# Patient Record
Sex: Female | Born: 1935 | Smoking: Former smoker
Health system: Southern US, Community
[De-identification: ages and names within clinical notes are randomized; demographics above are authoritative.]

## PROBLEM LIST (undated history)

## (undated) DIAGNOSIS — R531 Weakness: Secondary | ICD-10-CM

## (undated) DIAGNOSIS — R269 Unspecified abnormalities of gait and mobility: Secondary | ICD-10-CM

## (undated) DIAGNOSIS — E119 Type 2 diabetes mellitus without complications: Secondary | ICD-10-CM

## (undated) DIAGNOSIS — G609 Hereditary and idiopathic neuropathy, unspecified: Secondary | ICD-10-CM

## (undated) DIAGNOSIS — R7989 Other specified abnormal findings of blood chemistry: Secondary | ICD-10-CM

## (undated) DIAGNOSIS — E538 Deficiency of other specified B group vitamins: Secondary | ICD-10-CM

## (undated) DIAGNOSIS — G20 Parkinson's disease: Secondary | ICD-10-CM

## (undated) DIAGNOSIS — I10 Essential (primary) hypertension: Secondary | ICD-10-CM

## (undated) DIAGNOSIS — Z7409 Other reduced mobility: Secondary | ICD-10-CM

## (undated) DIAGNOSIS — E785 Hyperlipidemia, unspecified: Secondary | ICD-10-CM

## (undated) DIAGNOSIS — R262 Difficulty in walking, not elsewhere classified: Secondary | ICD-10-CM

## (undated) DIAGNOSIS — D649 Anemia, unspecified: Secondary | ICD-10-CM

## (undated) DIAGNOSIS — G20A1 Parkinson's disease without dyskinesia, without mention of fluctuations: Secondary | ICD-10-CM

## (undated) DIAGNOSIS — R410 Disorientation, unspecified: Secondary | ICD-10-CM

## (undated) HISTORY — DX: Other specified abnormal findings of blood chemistry: R79.89

## (undated) HISTORY — DX: Unspecified abnormalities of gait and mobility: R26.9

## (undated) HISTORY — DX: Deficiency of other specified B group vitamins: E53.8

## (undated) HISTORY — DX: Difficulty in walking, not elsewhere classified: R26.2

## (undated) HISTORY — DX: Parkinson's disease without dyskinesia, without mention of fluctuations: G20.A1

## (undated) HISTORY — DX: Weakness: R53.1

## (undated) HISTORY — DX: Hereditary and idiopathic neuropathy, unspecified: G60.9

## (undated) HISTORY — PX: APPENDECTOMY: SHX54

## (undated) HISTORY — DX: Essential (primary) hypertension: I10

## (undated) HISTORY — DX: Anemia, unspecified: D64.9

## (undated) HISTORY — DX: Other reduced mobility: Z74.09

## (undated) HISTORY — DX: Parkinson's disease: G20

## (undated) HISTORY — DX: Type 2 diabetes mellitus without complications: E11.9

## (undated) HISTORY — DX: Disorientation, unspecified: R41.0

## (undated) HISTORY — DX: Hyperlipidemia, unspecified: E78.5

---

## 2016-04-23 LAB — HEPATIC FUNCTION PANEL
AST: 19 U/L (ref 13–35)
Alkaline Phosphatase: 38 U/L (ref 25–125)
Bilirubin, Total: 0.4 mg/dL

## 2016-04-28 LAB — CBC AND DIFFERENTIAL
HCT: 34 % — AB (ref 36–46)
Hemoglobin: 11.9 g/dL — AB (ref 12.0–16.0)
PLATELETS: 238 10*3/uL (ref 150–399)
WBC: 8.9 10*3/mL

## 2016-04-28 LAB — BASIC METABOLIC PANEL
BUN: 10 mg/dL (ref 4–21)
Creatinine: 0.5 mg/dL (ref 0.5–1.1)
POTASSIUM: 4.1 mmol/L (ref 3.4–5.3)
SODIUM: 132 mmol/L — AB (ref 137–147)

## 2016-04-29 ENCOUNTER — Encounter: Payer: Self-pay | Admitting: Internal Medicine

## 2016-04-29 ENCOUNTER — Non-Acute Institutional Stay (SKILLED_NURSING_FACILITY): Payer: PPO | Admitting: Internal Medicine

## 2016-04-29 DIAGNOSIS — R2681 Unsteadiness on feet: Secondary | ICD-10-CM | POA: Diagnosis not present

## 2016-04-29 DIAGNOSIS — F329 Major depressive disorder, single episode, unspecified: Secondary | ICD-10-CM | POA: Diagnosis not present

## 2016-04-29 DIAGNOSIS — E039 Hypothyroidism, unspecified: Secondary | ICD-10-CM | POA: Insufficient documentation

## 2016-04-29 DIAGNOSIS — E538 Deficiency of other specified B group vitamins: Secondary | ICD-10-CM

## 2016-04-29 DIAGNOSIS — E119 Type 2 diabetes mellitus without complications: Secondary | ICD-10-CM

## 2016-04-29 DIAGNOSIS — E871 Hypo-osmolality and hyponatremia: Secondary | ICD-10-CM

## 2016-04-29 DIAGNOSIS — E038 Other specified hypothyroidism: Secondary | ICD-10-CM | POA: Diagnosis not present

## 2016-04-29 DIAGNOSIS — G2581 Restless legs syndrome: Secondary | ICD-10-CM

## 2016-04-29 DIAGNOSIS — G20 Parkinson's disease: Secondary | ICD-10-CM | POA: Diagnosis not present

## 2016-04-29 DIAGNOSIS — E785 Hyperlipidemia, unspecified: Secondary | ICD-10-CM | POA: Diagnosis not present

## 2016-04-29 DIAGNOSIS — R531 Weakness: Secondary | ICD-10-CM

## 2016-04-29 DIAGNOSIS — I10 Essential (primary) hypertension: Secondary | ICD-10-CM | POA: Diagnosis not present

## 2016-04-29 DIAGNOSIS — M810 Age-related osteoporosis without current pathological fracture: Secondary | ICD-10-CM | POA: Diagnosis not present

## 2016-04-29 DIAGNOSIS — R4189 Other symptoms and signs involving cognitive functions and awareness: Secondary | ICD-10-CM

## 2016-04-29 NOTE — Progress Notes (Signed)
LOCATION: [facility]  PCP: No primary care provider on file.   Code Status: Full Code  Goals of care: Advanced Directive information No flowsheet data found.     No emergency contact information on file.   No Known Allergies  Chief Complaint  Patient presents with  . New Admit To SNF    New Admission Visit     HPI:  Patient is a 82 y.o. female seen today for short term rehabilitation post hospital admission from 04/23/16-04/28/16 with altered mental state and gait instability. MRI brain was negative for acute intracranila abnormalities. She was placed on iv fluids and her hctz was discontinued. She was seen by neurology and her sinemet dosing was increased to 5 days a week. Her b12 supplement for b12 def anemia was continued. She has history of parkinson's disease and possible dementia. She is seen in her room today. She is pleasantly confused.  Review of Systems:  Review of Systems  Constitutional: Negative for fever, chills. Feels weak and tired.  HENT: Negative for congestion, hearing loss and sore throat.   Eyes: Negative for blurred vision, double vision and discharge.  Respiratory: Negative for cough, shortness of breath and wheezing.   Cardiovascular: Negative for chest pain, palpitations Gastrointestinal: Negative for heartburn, nausea, vomiting, abdominal pain. Had bowel movement this am Genitourinary: Negative for dysuria, flank pain.  Musculoskeletal: Negative for back pain, fall.  Skin: Negative for itching and rash.  Neurological: negative for dizziness and headaches.  Psychiatric/Behavioral: Negative for depression   Past Medical History:  Diagnosis Date  . Anemia   . B12 deficiency   . Delirium   . Diabetes mellitus without complication (HCC)   . Generalized weakness   . Hyperlipidemia   . Hypertension   . Idiopathic peripheral neuropathy (HCC)   . Inability to walk   . Mobility impaired   . Neurologic gait dysfunction   . Parkinson's  disease [facility])    Past Surgical History:  Procedure Laterality Date  . APPENDECTOMY     Social History:   reports that she has never smoked. She has never used smokeless tobacco. She reports that she does not drink alcohol. Her drug history is not on file.  Family History  Problem Relation Age of Onset  . Hypertension Mother   . Hypertension Father   . Heart disease Father   . Heart disease Sister   . Diabetes Sister   . Cancer Sister   . Cancer Brother     Medications:   Medication List       Accurate as of 04/29/16  1:24 PM. Always use your most recent med list.          alendronate 70 MG tablet Commonly known as:  FOSAMAX Take 70 mg by mouth once a week. Take with a full glass of water on an empty stomach.   ascorbic acid 500 MG tablet Commonly known as:  VITAMIN C Take 500 mg by mouth daily.   aspirin EC 81 MG tablet Take 81 mg by mouth daily.   b complex vitamins capsule Take 1 capsule by mouth daily.   Biotin [address] MCG Tabs Take 1 tablet by mouth daily.   calcium-vitamin D 250-125 MG-UNIT tablet Commonly known as:  OSCAL WITH D Take 1 tablet by mouth daily.   carbidopa-levodopa 25-100 MG tablet Commonly known as:  SINEMET IR Take 1 tablet by mouth 5 (five) times daily.   carboxymethylcellulose 0.5 % Soln Commonly known as:  REFRESH PLUS  Place 1 drop into both eyes as needed.   cyanocobalamin 1000 MCG/ML injection Commonly known as:  (VITAMIN B-12) Inject 1,000 mcg into the muscle every 30 (thirty) days. Give 1 mL into the muscle   DULoxetine 60 MG capsule Commonly known as:  CYMBALTA Take 60 mg by mouth daily.   hydrochlorothiazide 25 MG tablet Commonly known as:  HYDRODIURIL Take 25 mg by mouth daily as needed.   levothyroxine 50 MCG tablet Commonly known as:  SYNTHROID, LEVOTHROID Take 50 mcg by mouth daily before breakfast.   metFORMIN 500 MG tablet Commonly known as:  GLUCOPHAGE Take by mouth daily with breakfast.   metFORMIN 500  MG tablet Commonly known as:  GLUCOPHAGE Take 500 mg by mouth every evening.   mirtazapine 15 MG tablet Commonly known as:  REMERON Take 15 mg by mouth at bedtime.   rOPINIRole 0.5 MG tablet Commonly known as:  REQUIP Take 0.5 mg by mouth 3 (three) times daily.   rosuvastatin 10 MG tablet Commonly known as:  CRESTOR Take 10 mg by mouth daily.   thiamine 100 MG tablet Take 100 mg by mouth daily.   traMADol 50 MG tablet Commonly known as:  ULTRAM Take 50 mg by mouth every 6 (six) hours as needed for moderate pain.       Immunizations:  There is no immunization history on file for this patient.   Physical Exam:  Vitals:   04/29/16 1208  BP: (!) 181/77  Pulse: 90  Resp: 20  Temp: 97.8 F (36.6 C)  TempSrc: Oral  SpO2: 96%  Weight: 150 lb (68 kg)  Height: 5\' 6"  (1.676 m)   Body mass index is 24.21 kg/m.  General- elderly female, well built, frail, in no acute distress Head- normocephalic, atraumatic Nose- no maxillary or frontal sinus tenderness, no nasal discharge Throat- moist mucus membrane Eyes- PERRLA, EOMI, no pallor, no icterus, no discharge, normal conjunctiva, normal sclera Neck- no cervical lymphadenopathy Cardiovascular- normal s1,s2, no murmur, no leg edema Respiratory- bilateral clear to auscultation, no wheeze, no rhonchi, no crackles, no use of accessory muscles Abdomen- bowel sounds present, soft, non tender Musculoskeletal- able to move all 4 extremities, generalized weakness, on wheelchair Neurological- alert and oriented to self Skin- warm and dry, bruise to arms Psychiatry- normal mood and affect    Labs reviewed: Basic Metabolic Panel:  Recent Labs  16/05/9608/27/17  NA 132*  K 4.1  BUN 10  CREATININE 0.5   Liver Function Tests:  Recent Labs  04/23/16  AST 19  ALKPHOS 38   No results for input(s): LIPASE, AMYLASE in the last 8760 hours. No results for input(s): AMMONIA in the last 8760 hours. CBC:  Recent Labs  04/28/16    WBC 8.9  HGB 11.9*  HCT 34*  PLT 238     Radiological Exams: MRI Brain Wo Contrast 04/23/16  Impression. Brain: No acute infarction, hemorrhage, hydrocephalus, extra-axial collection or mass lesion. Mild chronic microvascular ischemic changes and parenchymal volume loss. Vascular: normal flow voids. Skull and upper cervical spine: Normal marrow signal. Sinuses/orbits: Negative. Bilateral intra-ocular lens replacements.   Assessment/Plan  Generalized weakness Will have her work with physical therapy and occupational therapy team to help with gait training and muscle strengthening exercises.fall precautions. Skin care. Encourage to be out of bed.   Gait instability With her parkinson's disease likely contributing along with deconditioning. Will have patient work with PT/OT as tolerated to regain strength and restore function.  Fall precautions are in place.  Parkinson's  disease Continue sinemet 25-100 mg five times a day. Fall precautions. Neurology follow up  Osteoporosis Continue her fosamax with ca-vit d  HLD Continue crestor  b12 def Continue monthly b12 injection, monitor cbc  Chronic depression Continue her cymbalta and remeron and monitor  Cognitive impairment SLP to evaluate. Assistance with feed for now and supportive care. Fall precautions  HTN Monitor bp and bmp, continue hctz 25 mg daily  Hyponatremia Monitor bmp with her on hctz  Hypothyroidism Continue levothyroxine and monitor  Dm type 2 Monitor cbg, continue metformin 500 mg q am and 1000 mg qhs  RLS Continue requip and monitor   Goals of care: short term rehabilitation   Labs/tests ordered: cbc, cmp   Family/ staff Communication: reviewed care plan with patient and nursing supervisor    Oneal Grout, MD Internal Medicine [facility] [address] [place], [place] [address] Cell Phone (Monday-Friday 8 am - 5 pm): [phone] On Call: [phone]  and follow prompts after 5 pm and on weekends Office Phone: [phone] Office Fax: [phone]

## 2016-05-03 LAB — CBC AND DIFFERENTIAL
HEMATOCRIT: 36 % (ref 36–46)
HEMOGLOBIN: 12.8 g/dL (ref 12.0–16.0)
Neutrophils Absolute: 5 /uL
Platelets: 266 10*3/uL (ref 150–399)
WBC: 8.1 10^3/mL

## 2016-05-03 LAB — BASIC METABOLIC PANEL
BUN: 11 mg/dL (ref 4–21)
CREATININE: 0.5 mg/dL (ref 0.5–1.1)
Glucose: 104 mg/dL
POTASSIUM: 3.6 mmol/L (ref 3.4–5.3)
SODIUM: 133 mmol/L — AB (ref 137–147)

## 2016-05-03 LAB — HEPATIC FUNCTION PANEL
ALK PHOS: 44 U/L (ref 25–125)
ALT: 6 U/L — AB (ref 7–35)
AST: 16 U/L (ref 13–35)
Bilirubin, Total: 0.6 mg/dL

## 2016-05-24 ENCOUNTER — Non-Acute Institutional Stay (SKILLED_NURSING_FACILITY): Payer: PPO | Admitting: Adult Health

## 2016-05-24 ENCOUNTER — Encounter: Payer: Self-pay | Admitting: Adult Health

## 2016-05-24 DIAGNOSIS — R4189 Other symptoms and signs involving cognitive functions and awareness: Secondary | ICD-10-CM

## 2016-05-24 DIAGNOSIS — G4700 Insomnia, unspecified: Secondary | ICD-10-CM

## 2016-05-24 DIAGNOSIS — F329 Major depressive disorder, single episode, unspecified: Secondary | ICD-10-CM

## 2016-05-24 DIAGNOSIS — E1310 Other specified diabetes mellitus with ketoacidosis without coma: Secondary | ICD-10-CM | POA: Diagnosis not present

## 2016-05-24 DIAGNOSIS — M810 Age-related osteoporosis without current pathological fracture: Secondary | ICD-10-CM

## 2016-05-24 DIAGNOSIS — I10 Essential (primary) hypertension: Secondary | ICD-10-CM

## 2016-05-24 DIAGNOSIS — E785 Hyperlipidemia, unspecified: Secondary | ICD-10-CM | POA: Diagnosis not present

## 2016-05-24 DIAGNOSIS — R531 Weakness: Secondary | ICD-10-CM

## 2016-05-24 DIAGNOSIS — Z794 Long term (current) use of insulin: Secondary | ICD-10-CM | POA: Diagnosis not present

## 2016-05-24 DIAGNOSIS — E038 Other specified hypothyroidism: Secondary | ICD-10-CM

## 2016-05-24 DIAGNOSIS — G2581 Restless legs syndrome: Secondary | ICD-10-CM | POA: Diagnosis not present

## 2016-05-24 DIAGNOSIS — E1110 Type 2 diabetes mellitus with ketoacidosis without coma: Secondary | ICD-10-CM

## 2016-05-24 DIAGNOSIS — G20 Parkinson's disease: Secondary | ICD-10-CM

## 2016-05-24 NOTE — Progress Notes (Signed)
Patient ID: Brandy Martinez, female   DOB: Apr 09, 1932, 82 y.o.   MRN: [id number]    DATE:    05/24/16   MRN:  [id number]  BIRTHDAY: Apr 09, 1932  Facility:  Nursing Home Location:  [facility]  Nursing Home Room Number: 901-B  LEVEL OF CARE:  SNF [phone])  Contact Information    Name Relation Home Work Mobile   KirkAustin,Richard Nephew [phone]  [phone]   Drucilla SchmidtBean,Cindy Niece [phone]       Code Status History    This patient does not have a recorded code status. Please follow your organizational policy for patients in this situation.       Chief Complaint  Patient presents with  . Medical Management of Chronic Issues    HISTORY OF PRESENT ILLNESS:  This is an 82 year old female who is currently having a short-term rehabilitation @ [address] [facility]. She is being seen for a routine visit. Marland Kitchen. She was recently started on Melatonin for insomnia. HCTZ PRN is now given daily routinely.  [facility]  04/23/16 - 04/28/16. She was having altered mental state and gait instability. MRI brain was negative for acute intracranial abnormalities. She was given IV fluids and her HCTZ was discontinued. Neurology was consulted and Sinemet dosing was increased to 5 days a week. Her B12 supplementation was continued.   PAST MEDICAL HISTORY:  Past Medical History:  Diagnosis Date  . Anemia   . B12 deficiency   . Delirium   . Diabetes mellitus without complication (HCC)   . Generalized weakness   . Hyperlipidemia   . Hypertension   . Idiopathic peripheral neuropathy   . Inability to walk   . Mobility impaired   . Neurologic gait dysfunction   . Parkinson's disease (HCC)      CURRENT MEDICATIONS: Reviewed  Patient's Medications  New Prescriptions   No medications on file  Previous Medications   ACETAMINOPHEN (TYLENOL) 325 MG TABLET    Take 650 mg by mouth every  4 (four) hours as needed for mild pain or moderate pain.   ALENDRONATE (FOSAMAX) 70 MG TABLET    Take 70 mg by mouth once a week. Take with a full glass of water on an empty stomach.  Take on Thursdays.   ASCORBIC ACID (VITAMIN C) 500 MG TABLET    Take 500 mg by mouth daily.   ASPIRIN EC 81 MG TABLET    Take 81 mg by mouth daily.   B COMPLEX VITAMINS CAPSULE    Take 1 capsule by mouth daily.   BIOTIN 3875610000 MCG TABS    Take 1 tablet by mouth daily.   CALCIUM-VITAMIN D (OSCAL WITH D) 500-200 MG-UNIT TABLET    Take 1 tablet by mouth daily.   CARBIDOPA-LEVODOPA (SINEMET IR) 25-100 MG TABLET    Take 1 tablet by mouth 5 (five) times daily.    CARBOXYMETHYLCELLULOSE (REFRESH PLUS) 0.5 % SOLN    Place 1 drop into both eyes as needed.   CYANOCOBALAMIN (,VITAMIN B-12,) 1000 MCG/ML INJECTION    Inject 1,000 mcg into the muscle every 30 (thirty) days. Give 1 mL into the muscle   DULOXETINE (CYMBALTA) 60 MG CAPSULE    Take 60 mg by mouth daily.   HYDROCHLOROTHIAZIDE (HYDRODIURIL) 25 MG TABLET    Take 25 mg by mouth daily as needed.   LEVOTHYROXINE (SYNTHROID, LEVOTHROID) 50 MCG TABLET    Take 50 mcg by  mouth daily before breakfast.   MELATONIN 3 MG TABS    Take 1 tablet by mouth at bedtime.   METFORMIN (GLUCOPHAGE) 500 MG TABLET    Take by mouth daily with breakfast.   METFORMIN (GLUCOPHAGE) 500 MG TABLET    Take 1,000 mg by mouth every evening.    METHYLFOL-METHYLCOB-ACETYLCYST (CEREFOLIN NAC) 6-2-600 MG TABS    Take 1 tablet by mouth at bedtime.   MIRTAZAPINE (REMERON) 15 MG TABLET    Take 15 mg by mouth at bedtime.    ROPINIROLE (REQUIP) 0.5 MG TABLET    Take 0.5 mg by mouth 3 (three) times daily.    ROSUVASTATIN (CRESTOR) 10 MG TABLET    Take 10 mg by mouth daily.   THIAMINE 100 MG TABLET    Take 100 mg by mouth daily.   TRAMADOL (ULTRAM) 50 MG TABLET    Take 50 mg by mouth every 6 (six) hours as needed for moderate pain.  Modified Medications   No medications on file  Discontinued Medications    CALCIUM-VITAMIN D (OSCAL WITH D) 250-125 MG-UNIT TABLET    Take 1 tablet by mouth daily.     No Known Allergies   REVIEW OF SYSTEMS:  GENERAL: no change in appetite, no fatigue, no weight changes, no fever, chills or weakness EYES: Denies change in vision, dry eyes, eye pain, itching or discharge EARS: Denies change in hearing, ringing in ears, or earache NOSE: Denies nasal congestion or epistaxis MOUTH and THROAT: Denies oral discomfort, gingival pain or bleeding, pain from teeth or hoarseness   RESPIRATORY: no cough, SOB, DOE, wheezing, hemoptysis CARDIAC: no chest pain, edema or palpitations GI: no abdominal pain, diarrhea, constipation, heart burn, nausea or vomiting GU: Denies dysuria, frequency, hematuria, incontinence, or discharge PSYCHIATRIC: Denies feeling of depression or anxiety. No report of hallucinations, insomnia, paranoia, or agitation     PHYSICAL EXAMINATION  GENERAL APPEARANCE: Well nourished. In no acute distress. Normal body habitus SKIN:  Skin is warm and dry.  HEAD: Normal in size and contour. No evidence of trauma EYES: Lids open and close normally. No blepharitis, entropion or ectropion. PERRL. Conjunctivae are clear and sclerae are white. Lenses are without opacity EARS: Pinnae are normal. Patient hears normal voice tunes of the examiner MOUTH and THROAT: Lips are without lesions. Oral mucosa is moist and without lesions. Tongue is normal in shape, size, and color and without lesions NECK: supple, trachea midline, no neck masses, no thyroid tenderness, no thyromegaly LYMPHATICS: no LAN in the neck, no supraclavicular LAN RESPIRATORY: breathing is even & unlabored, BS CTAB CARDIAC: RRR, no murmur,no extra heart sounds, no edema GI: abdomen soft, normal BS, no masses, no tenderness, no hepatomegaly, no splenomegaly EXTREMITIES:  Able to move X 4 extremities PSYCHIATRIC: Alert and oriented X 3. Affect and behavior are appropriate  LABS/RADIOLOGY: Labs  reviewed: Basic Metabolic Panel:  Recent Labs  16/10/96 05/03/16  NA 132* 133*  K 4.1 3.6  BUN 10 11  CREATININE 0.5 0.5   Liver Function Tests:  Recent Labs  04/23/16 05/03/16  AST 19 16  ALT  --  6*  ALKPHOS 38 44   CBC:  Recent Labs  04/28/16 05/03/16  WBC 8.9 8.1  NEUTROABS  --  5  HGB 11.9* 12.8  HCT 34* 36  PLT 238 266       ASSESSMENT/PLAN:  Generalized weakness - continue rehabilitation, PT and OT, for therapeutic strengthening exercises; fall precaution  Parkinson's disease -  Continue Sinemet 25-100  1 tab PO 5X/day   Hypothyroidism - continue Levothyroxine 50 mcg 1 tab PO Q D  Osteoporosis - Continue Fosamax 70 mg 1 tab PO Q Thursdays and Os-Cal 500 mg  Depression - continue Remeron 15 mg 1 tab PO Q HS  Diabetes mellitus, type 2 - continue Metformin 500 mg 1 tab PO Q AM and 2 tabs PO Q PM  Hypertension - well-controlled; continue Hydrochlorothiazide 25 mg daily  Insomnia - recently started on Melatonin 3 mg 1 tab PO Q HS  Hyperlipidemia - continue Crestor 10 mg 1 tab PO Q D  Cognitive impairment - continue SLP treatments for linguistic exercises, effective communication and safety awareness  Restless leg syndrome -  Requip 0.5 mg 1 tab PO TID      Goals of care:  Short-term rehabilitation    Kenard Gower, NP [facility] [phone]

## 2016-06-16 ENCOUNTER — Non-Acute Institutional Stay (SKILLED_NURSING_FACILITY): Payer: PPO | Admitting: Adult Health

## 2016-06-16 ENCOUNTER — Encounter: Payer: Self-pay | Admitting: Adult Health

## 2016-06-16 DIAGNOSIS — G2581 Restless legs syndrome: Secondary | ICD-10-CM

## 2016-06-16 DIAGNOSIS — Z794 Long term (current) use of insulin: Secondary | ICD-10-CM | POA: Diagnosis not present

## 2016-06-16 DIAGNOSIS — G20 Parkinson's disease: Secondary | ICD-10-CM | POA: Diagnosis not present

## 2016-06-16 DIAGNOSIS — E1110 Type 2 diabetes mellitus with ketoacidosis without coma: Secondary | ICD-10-CM

## 2016-06-16 DIAGNOSIS — I10 Essential (primary) hypertension: Secondary | ICD-10-CM | POA: Diagnosis not present

## 2016-06-16 DIAGNOSIS — E1310 Other specified diabetes mellitus with ketoacidosis without coma: Secondary | ICD-10-CM

## 2016-06-16 DIAGNOSIS — G4700 Insomnia, unspecified: Secondary | ICD-10-CM

## 2016-06-16 DIAGNOSIS — M810 Age-related osteoporosis without current pathological fracture: Secondary | ICD-10-CM | POA: Diagnosis not present

## 2016-06-16 DIAGNOSIS — E785 Hyperlipidemia, unspecified: Secondary | ICD-10-CM

## 2016-06-16 DIAGNOSIS — E038 Other specified hypothyroidism: Secondary | ICD-10-CM | POA: Diagnosis not present

## 2016-06-16 DIAGNOSIS — F329 Major depressive disorder, single episode, unspecified: Secondary | ICD-10-CM | POA: Diagnosis not present

## 2016-06-16 NOTE — Progress Notes (Signed)
Patient ID: Brandy Martinez, female   DOB: 04-14-1932, 82 y.o.   MRN: [id number]    DATE:    06/16/16  MRN:  [id number]  BIRTHDAY: 1931-08-09  Facility:  Nursing Home Location:  [facility]  Nursing Home Room Number: 901-B  LEVEL OF CARE:  SNF [phone])  Contact Information    Name Relation Home Work Mobile   [place] [phone]  [phone]   Drucilla Schmidt Niece [phone]       Code Status History    This patient does not have a recorded code status. Please follow your organizational policy for patients in this situation.       Chief Complaint  Patient presents with  . Medical Management of Chronic Issues    HISTORY OF PRESENT ILLNESS:  This is an 82 year old female who is currently having a short-term rehabilitation @ [address] She is being seen for a routine visit. Marland Kitchen She was recently discharged from PT and OT.   She has been admitted to [facility] on 04/28/16 from [facility] hospitalization  04/23/16 - 04/28/16. She was having altered mental state and gait instability. MRI brain was negative for acute intracranial abnormalities. She was given IV fluids and her HCTZ was discontinued. Neurology was consulted and Sinemet dosing was increased to 5 days a week. Her B12 supplementation was continued.   PAST MEDICAL HISTORY:  Past Medical History:  Diagnosis Date  . Anemia   . B12 deficiency   . Delirium   . Diabetes mellitus without complication (HCC)   . Generalized weakness   . Hyperlipidemia   . Hypertension   . Idiopathic peripheral neuropathy   . Inability to walk   . Mobility impaired   . Neurologic gait dysfunction   . Parkinson's disease (HCC)      CURRENT MEDICATIONS: Reviewed  Patient's Medications  New Prescriptions   No medications on file  Previous Medications   ACETAMINOPHEN (TYLENOL) 325 MG TABLET    Take 650 mg by mouth every 4 (four) hours as needed for mild pain or  moderate pain.   ALENDRONATE (FOSAMAX) 70 MG TABLET    Take 70 mg by mouth once a week. Take with a full glass of water on an empty stomach.  Take on Thursdays.   ASCORBIC ACID (VITAMIN C) 500 MG TABLET    Take 500 mg by mouth daily.   ASPIRIN EC 81 MG TABLET    Take 81 mg by mouth daily.   B COMPLEX VITAMINS CAPSULE    Take 1 capsule by mouth daily.   BIOTIN 38756 MCG TABS    Take 1 tablet by mouth daily.   CALCIUM-VITAMIN D (OSCAL WITH D) 500-200 MG-UNIT TABLET    Take 1 tablet by mouth daily.   CARBIDOPA-LEVODOPA (SINEMET IR) 25-100 MG TABLET    Take 1 tablet by mouth 5 (five) times daily.    CARBOXYMETHYLCELLULOSE (REFRESH PLUS) 0.5 % SOLN    Place 1 drop into both eyes as needed.   CYANOCOBALAMIN (,VITAMIN B-12,) 1000 MCG/ML INJECTION    Inject 1,000 mcg into the muscle every 30 (thirty) days. Give 1 mL into the muscle   DULOXETINE (CYMBALTA) 60 MG CAPSULE    Take 60 mg by mouth daily.   HYDROCHLOROTHIAZIDE (HYDRODIURIL) 25 MG TABLET    Take 25 mg by mouth daily as needed.   LEVOTHYROXINE (SYNTHROID, LEVOTHROID) 50 MCG TABLET    Take 50 mcg by mouth daily before breakfast.   MELATONIN  3 MG TABS    Take 1 tablet by mouth at bedtime.   METFORMIN (GLUCOPHAGE) 500 MG TABLET    Take by mouth daily with breakfast.   METFORMIN (GLUCOPHAGE) 500 MG TABLET    Take 1,000 mg by mouth every evening.    METHYLFOL-METHYLCOB-ACETYLCYST (CEREFOLIN NAC) 6-2-600 MG TABS    Take 1 tablet by mouth at bedtime.   MIRTAZAPINE (REMERON) 15 MG TABLET    Take 15 mg by mouth at bedtime.    ROPINIROLE (REQUIP) 0.5 MG TABLET    Take 0.5 mg by mouth 3 (three) times daily.    ROSUVASTATIN (CRESTOR) 10 MG TABLET    Take 10 mg by mouth daily.   THIAMINE 100 MG TABLET    Take 100 mg by mouth daily.   TRAMADOL (ULTRAM) 50 MG TABLET    Take 50 mg by mouth every 6 (six) hours as needed for moderate pain.  Modified Medications   No medications on file  Discontinued Medications   No medications on file     No Known  Allergies   REVIEW OF SYSTEMS:  GENERAL: no change in appetite, no fatigue, no weight changes, no fever, chills or weakness EYES: Denies change in vision, dry eyes, eye pain, itching or discharge EARS: Denies change in hearing, ringing in ears, or earache NOSE: Denies nasal congestion or epistaxis MOUTH and THROAT: Denies oral discomfort, gingival pain or bleeding, pain from teeth or hoarseness   RESPIRATORY: no cough, SOB, DOE, wheezing, hemoptysis CARDIAC: no chest pain, edema or palpitations GI: no abdominal pain, diarrhea, constipation, heart burn, nausea or vomiting GU: Denies dysuria, frequency, hematuria, incontinence, or discharge PSYCHIATRIC: Denies feeling of depression or anxiety. No report of hallucinations, insomnia, paranoia, or agitation     PHYSICAL EXAMINATION  GENERAL APPEARANCE: Well nourished. In no acute distress. Normal body habitus SKIN:  Skin is warm and dry.  HEAD: Normal in size and contour. No evidence of trauma EYES: Lids open and close normally. No blepharitis, entropion or ectropion. PERRL. Conjunctivae are clear and sclerae are white. Lenses are without opacity EARS: Pinnae are normal. Patient hears normal voice tunes of the examiner MOUTH and THROAT: Lips are without lesions. Oral mucosa is moist and without lesions. Tongue is normal in shape, size, and color and without lesions NECK: supple, trachea midline, no neck masses, no thyroid tenderness, no thyromegaly LYMPHATICS: no LAN in the neck, no supraclavicular LAN RESPIRATORY: breathing is even & unlabored, BS CTAB CARDIAC: RRR, no murmur,no extra heart sounds, LLE 1+ edema GI: abdomen soft, normal BS, no masses, no tenderness, no hepatomegaly, no splenomegaly EXTREMITIES:  Able to move X 4 extremities PSYCHIATRIC: Alert and oriented X 3. Affect and behavior are appropriate  LABS/RADIOLOGY: Labs reviewed: Basic Metabolic Panel:  Recent Labs  11/91/4709/27/17 05/03/16  NA 132* 133*  K 4.1 3.6  BUN  10 11  CREATININE 0.5 0.5   Liver Function Tests:  Recent Labs  04/23/16 05/03/16  AST 19 16  ALT  --  6*  ALKPHOS 38 44   CBC:  Recent Labs  04/28/16 05/03/16  WBC 8.9 8.1  NEUTROABS  --  5  HGB 11.9* 12.8  HCT 34* 36  PLT 238 266       ASSESSMENT/PLAN:  Parkinson's disease -  Continue Sinemet 25-100 mg 1 tab PO 5X/day   Hypothyroidism - continue Levothyroxine 50 mcg 1 tab PO Q D; check tsh  Osteoporosis - Continue Fosamax 70 mg 1 tab PO Q Thursdays and Os-Cal  500 mg  Depression - continue Remeron 15 mg 1 tab PO Q HS  Diabetes mellitus, type 2 - continue Metformin 500 mg 1 tab PO Q AM and 2 tabs PO Q PM; Check hemoglobin A1c; ophthalmology and podiatry consult  Hypertension - well-controlled; continue Hydrochlorothiazide 25 mg daily; check BMP  Insomnia - continue Melatonin 3 mg 1 tab PO Q HS  Hyperlipidemia - continue Crestor 10 mg 1 tab PO Q D; check lipid panel  Restless leg syndrome -  Requip 0.5 mg 1 tab PO TID      Goals of care:  Long-term care    Kenard GowerMonina Medina-Vargas, NP [facility] [phone]

## 2016-06-17 LAB — HEMOGLOBIN A1C: Hemoglobin A1C: 6.5

## 2016-06-17 LAB — BASIC METABOLIC PANEL
BUN: 10 mg/dL (ref 4–21)
CREATININE: 0.6 mg/dL (ref 0.5–1.1)
Glucose: 90 mg/dL
POTASSIUM: 3.8 mmol/L (ref 3.4–5.3)
SODIUM: 138 mmol/L (ref 137–147)

## 2016-06-17 LAB — LIPID PANEL
CHOLESTEROL: 144 mg/dL (ref 0–200)
HDL: 54 mg/dL (ref 35–70)
LDL Cholesterol: 69 mg/dL
Triglycerides: 106 mg/dL (ref 40–160)

## 2016-06-17 LAB — TSH: TSH: 6.38 u[IU]/mL — AB (ref 0.41–5.90)

## 2016-06-18 ENCOUNTER — Non-Acute Institutional Stay (SKILLED_NURSING_FACILITY): Payer: PPO | Admitting: Adult Health

## 2016-06-18 ENCOUNTER — Encounter: Payer: Self-pay | Admitting: Adult Health

## 2016-06-18 DIAGNOSIS — E038 Other specified hypothyroidism: Secondary | ICD-10-CM

## 2016-06-18 DIAGNOSIS — R7989 Other specified abnormal findings of blood chemistry: Secondary | ICD-10-CM

## 2016-06-18 HISTORY — DX: Other specified abnormal findings of blood chemistry: R79.89

## 2016-06-18 NOTE — Progress Notes (Signed)
Patient ID: Brandy Martinez, female   DOB: 11-26-1931, 82 y.o.   MRN: [id number]    DATE:    06/18/16  MRN:  [id number]  BIRTHDAY: 11-26-1931  Facility:  Nursing Home Location:  [facility]  Nursing Home Room Number: 901-B  LEVEL OF CARE:  SNF [id number](31)  Contact Information    Name Relation Home Work Mobile   [place],Richard Nephew [phone]  [phone]   Drucilla SchmidtBean,Cindy Niece [phone]       Code Status History    This patient does not have a recorded code status. Please follow your organizational policy for patients in this situation.      HISTORY OF PRESENT ILLNESS:  This is an 82 year old female who is currently having a short-term rehabilitation @ [address] [facility]. She is currently having PT for strengthening exercises.  Latest tsh 6.380. Elevated. She is currently taking Synthroid 50 mcg daily for hypothyroidism. Latest weight is 163.8, stable.  She has been admitted to [facility] on 04/28/16 from [facility] hospitalization  04/23/16 - 04/28/16. She was having altered mental state and gait instability. MRI brain was negative for acute intracranial abnormalities. She was given IV fluids and her HCTZ was discontinued. Neurology was consulted and Sinemet dosing was increased to 5 days a week. Her B12 supplementation was continued.   PAST MEDICAL HISTORY:  Past Medical History:  Diagnosis Date  . Anemia   . B12 deficiency   . Delirium   . Diabetes mellitus without complication (HCC)   . Elevated TSH 06/18/2016  . Generalized weakness   . Hyperlipidemia   . Hypertension   . Idiopathic peripheral neuropathy   . Inability to walk   . Mobility impaired   . Neurologic gait dysfunction   . Parkinson's disease (HCC)      CURRENT MEDICATIONS: Reviewed  Patient's Medications  New Prescriptions   No medications on file  Previous Medications   ACETAMINOPHEN (TYLENOL) 325 MG TABLET    Take 650 mg by mouth every 4 (four)  hours as needed for mild pain or moderate pain.   ALENDRONATE (FOSAMAX) 70 MG TABLET    Take 70 mg by mouth once a week. Take with a full glass of water on an empty stomach.  Take on Thursdays.   ASCORBIC ACID (VITAMIN C) 500 MG TABLET    Take 500 mg by mouth daily.   ASPIRIN EC 81 MG TABLET    Take 81 mg by mouth daily.   B COMPLEX VITAMINS CAPSULE    Take 1 capsule by mouth daily.   BIOTIN 6063010000 MCG TABS    Take 1 tablet by mouth daily.   CALCIUM-VITAMIN D (OSCAL WITH D) 500-200 MG-UNIT TABLET    Take 1 tablet by mouth daily.   CARBIDOPA-LEVODOPA (SINEMET IR) 25-100 MG TABLET    Take 1 tablet by mouth 5 (five) times daily.    CARBOXYMETHYLCELLULOSE (REFRESH PLUS) 0.5 % SOLN    Place 1 drop into both eyes as needed.   CYANOCOBALAMIN (,VITAMIN B-12,) 1000 MCG/ML INJECTION    Inject 1,000 mcg into the muscle every 30 (thirty) days. Give 1 mL into the muscle   DULOXETINE (CYMBALTA) 60 MG CAPSULE    Take 60 mg by mouth daily.   HYDROCHLOROTHIAZIDE (HYDRODIURIL) 25 MG TABLET    Take 25 mg by mouth daily as needed.   LEVOTHYROXINE (SYNTHROID, LEVOTHROID) 50 MCG TABLET    Take 50 mcg by mouth daily before breakfast.   MELATONIN 3 MG  TABS    Take 1 tablet by mouth at bedtime.   METFORMIN (GLUCOPHAGE) 500 MG TABLET    Take by mouth daily with breakfast.   METFORMIN (GLUCOPHAGE) 500 MG TABLET    Take 1,000 mg by mouth every evening.    METHYLFOL-METHYLCOB-ACETYLCYST (CEREFOLIN NAC) 6-2-600 MG TABS    Take 1 tablet by mouth at bedtime.   MIRTAZAPINE (REMERON) 15 MG TABLET    Take 15 mg by mouth at bedtime.    ROPINIROLE (REQUIP) 0.5 MG TABLET    Take 0.5 mg by mouth 3 (three) times daily.    ROSUVASTATIN (CRESTOR) 10 MG TABLET    Take 10 mg by mouth daily.   THIAMINE 100 MG TABLET    Take 100 mg by mouth daily.   TRAMADOL (ULTRAM) 50 MG TABLET    Take 50 mg by mouth every 6 (six) hours as needed for moderate pain.  Modified Medications   No medications on file  Discontinued Medications   No  medications on file     No Known Allergies   REVIEW OF SYSTEMS:  GENERAL: no change in appetite, no fatigue, no weight changes, no fever, chills or weakness EYES: Denies change in vision, dry eyes, eye pain, itching or discharge EARS: Denies change in hearing, ringing in ears, or earache NOSE: Denies nasal congestion or epistaxis MOUTH and THROAT: Denies oral discomfort, gingival pain or bleeding, pain from teeth or hoarseness   RESPIRATORY: no cough, SOB, DOE, wheezing, hemoptysis CARDIAC: no chest pain, edema or palpitations GI: no abdominal pain, diarrhea, constipation, heart burn, nausea or vomiting GU: Denies dysuria, frequency, hematuria, incontinence, or discharge PSYCHIATRIC: Denies feeling of depression or anxiety. No report of hallucinations, insomnia, paranoia, or agitation     PHYSICAL EXAMINATION  GENERAL APPEARANCE: Well nourished. In no acute distress. Normal body habitus SKIN:  Skin is warm and dry.  HEAD: Normal in size and contour. No evidence of trauma EYES: Lids open and close normally. No blepharitis, entropion or ectropion. PERRL. Conjunctivae are clear and sclerae are white. Lenses are without opacity EARS: Pinnae are normal. Patient hears normal voice tunes of the examiner MOUTH and THROAT: Lips are without lesions. Oral mucosa is moist and without lesions. Tongue is normal in shape, size, and color and without lesions NECK: supple, trachea midline, no neck masses, no thyroid tenderness, no thyromegaly LYMPHATICS: no LAN in the neck, no supraclavicular LAN RESPIRATORY: breathing is even & unlabored, BS CTAB CARDIAC: RRR, no murmur,no extra heart sounds, LLE 1+ edema GI: abdomen soft, normal BS, no masses, no tenderness, no hepatomegaly, no splenomegaly EXTREMITIES:  Able to move X 4 extremities PSYCHIATRIC: Alert and oriented X 3. Affect and behavior are appropriate  LABS/RADIOLOGY: Labs reviewed: Basic Metabolic Panel:  Recent Labs  16/05/9608/27/17  05/03/16 06/17/16  NA 132* 133* 138  K 4.1 3.6 3.8  BUN 10 11 10   CREATININE 0.5 0.5 0.6   Liver Function Tests:  Recent Labs  04/23/16 05/03/16  AST 19 16  ALT  --  6*  ALKPHOS 38 44   CBC:  Recent Labs  04/28/16 05/03/16  WBC 8.9 8.1  NEUTROABS  --  5  HGB 11.9* 12.8  HCT 34* 36  PLT 238 266       ASSESSMENT/PLAN:  Hypothyroidism - increase Levothyroxine from 50 mcg to 75 mcg 1 tab PO Q D; check tsh in 6 weeks Lab Results  Component Value Date   TSH 6.38 (A) 06/17/2016  Durenda Age, NP [employer] [phone]

## 2016-07-19 ENCOUNTER — Non-Acute Institutional Stay (SKILLED_NURSING_FACILITY): Payer: Medicare Other | Admitting: Adult Health

## 2016-07-19 ENCOUNTER — Encounter: Payer: Self-pay | Admitting: Adult Health

## 2016-07-19 DIAGNOSIS — E119 Type 2 diabetes mellitus without complications: Secondary | ICD-10-CM

## 2016-07-19 DIAGNOSIS — M810 Age-related osteoporosis without current pathological fracture: Secondary | ICD-10-CM

## 2016-07-19 DIAGNOSIS — G20A1 Parkinson's disease without dyskinesia, without mention of fluctuations: Secondary | ICD-10-CM

## 2016-07-19 DIAGNOSIS — G2581 Restless legs syndrome: Secondary | ICD-10-CM | POA: Diagnosis not present

## 2016-07-19 DIAGNOSIS — E785 Hyperlipidemia, unspecified: Secondary | ICD-10-CM

## 2016-07-19 DIAGNOSIS — F329 Major depressive disorder, single episode, unspecified: Secondary | ICD-10-CM

## 2016-07-19 DIAGNOSIS — E038 Other specified hypothyroidism: Secondary | ICD-10-CM | POA: Diagnosis not present

## 2016-07-19 DIAGNOSIS — G20 Parkinson's disease: Secondary | ICD-10-CM

## 2016-07-19 DIAGNOSIS — G4700 Insomnia, unspecified: Secondary | ICD-10-CM

## 2016-07-19 DIAGNOSIS — I10 Essential (primary) hypertension: Secondary | ICD-10-CM

## 2016-07-19 NOTE — Progress Notes (Signed)
Patient ID: Brandy Martinez, female   DOB: [birth year], 82 y.o.   MRN: [id number]    DATE:    07/19/16  MRN:  [id number]  BIRTHDAY: [birth year]  Facility:  Nursing Home Location:  [facility]  Nursing Home Room Number: 901-B  LEVEL OF CARE:  SNF [id number]  Contact Information    Name Relation Home Work Mobile   [place] [phone]  [phone]   Drucilla Schmidt Niece [phone]       Code Status History    This patient does not have a recorded code status. Please follow your organizational policy for patients in this situation.       Chief Complaint  Patient presents with  . Medical Management of Chronic Issues    HISTORY OF PRESENT ILLNESS:  This is an 82 year old female who is currently having a short-term rehabilitation @ [address] She is being seen for a routine visit. Marland Kitchen She was recently discharged from PT services. Synthroid dosage was recently increased to 75 mcg daily.   PAST MEDICAL HISTORY:  Past Medical History:  Diagnosis Date  . Anemia   . B12 deficiency   . Delirium   . Diabetes mellitus without complication (HCC)   . Elevated TSH 06/18/2016  . Generalized weakness   . Hyperlipidemia   . Hypertension   . Idiopathic peripheral neuropathy   . Inability to walk   . Mobility impaired   . Neurologic gait dysfunction   . Parkinson's disease (HCC)      CURRENT MEDICATIONS: Reviewed  Patient's Medications  New Prescriptions   No medications on file  Previous Medications   ACETAMINOPHEN (TYLENOL) 325 MG TABLET    Take 650 mg by mouth every 4 (four) hours as needed for mild pain or moderate pain.   ALENDRONATE (FOSAMAX) 70 MG TABLET    Take 70 mg by mouth once a week. Take with a full glass of water on an empty stomach.  Take on Thursdays.   ASCORBIC ACID (VITAMIN C) 500 MG TABLET    Take 500 mg by mouth daily.   ASPIRIN EC 81 MG TABLET    Take 81 mg by mouth daily.   B COMPLEX VITAMINS CAPSULE    Take  1 capsule by mouth daily.   BIOTIN 38756 MCG TABS    Take 1 tablet by mouth daily.   CALCIUM-VITAMIN D (OSCAL WITH D) 500-200 MG-UNIT TABLET    Take 1 tablet by mouth daily.   CARBIDOPA-LEVODOPA (SINEMET IR) 25-100 MG TABLET    Take 1 tablet by mouth 5 (five) times daily.    CARBOXYMETHYLCELLULOSE (REFRESH PLUS) 0.5 % SOLN    Place 1 drop into both eyes as needed.   CYANOCOBALAMIN (,VITAMIN B-12,) 1000 MCG/ML INJECTION    Inject 1,000 mcg into the muscle every 30 (thirty) days. Give 1 mL into the muscle   DULOXETINE (CYMBALTA) 60 MG CAPSULE    Take 60 mg by mouth daily.   HYDROCHLOROTHIAZIDE (HYDRODIURIL) 25 MG TABLET    Take 25 mg by mouth daily as needed.   LEVOTHYROXINE (SYNTHROID, LEVOTHROID) 75 MCG TABLET    Take 75 mcg by mouth daily before breakfast.   MELATONIN 3 MG TABS    Take 1 tablet by mouth at bedtime.   METFORMIN (GLUCOPHAGE) 500 MG TABLET    Take 1,000 mg by mouth every evening.    METFORMIN (GLUCOPHAGE) 500 MG TABLET    Take 500 mg by mouth daily with breakfast.    METHYLFOL-METHYLCOB-ACETYLCYST (  CEREFOLIN NAC) 6-2-600 MG TABS    Take 1 tablet by mouth at bedtime.   MIRTAZAPINE (REMERON) 15 MG TABLET    Take 15 mg by mouth at bedtime.    ROPINIROLE (REQUIP) 0.5 MG TABLET    Take 0.5 mg by mouth 3 (three) times daily.    ROSUVASTATIN (CRESTOR) 10 MG TABLET    Take 10 mg by mouth daily.   THIAMINE 100 MG TABLET    Take 100 mg by mouth daily.   TRAMADOL (ULTRAM) 50 MG TABLET    Take 50 mg by mouth every 6 (six) hours as needed for moderate pain.  Modified Medications   No medications on file  Discontinued Medications   LEVOTHYROXINE (SYNTHROID, LEVOTHROID) 50 MCG TABLET    Take 50 mcg by mouth daily before breakfast.     No Known Allergies   REVIEW OF SYSTEMS:  GENERAL: no change in appetite, no fatigue, no weight changes, no fever, chills or weakness EYES: Denies change in vision, dry eyes, eye pain, itching or discharge EARS: Denies change in hearing, ringing in ears,  or earache NOSE: Denies nasal congestion or epistaxis MOUTH and THROAT: Denies oral discomfort, gingival pain or bleeding, pain from teeth or hoarseness   RESPIRATORY: no cough, SOB, DOE, wheezing, hemoptysis CARDIAC: no chest pain, edema or palpitations GI: no abdominal pain, diarrhea, constipation, heart burn, nausea or vomiting GU: Denies dysuria, frequency, hematuria, incontinence, or discharge PSYCHIATRIC: Denies feeling of depression or anxiety. No report of hallucinations, insomnia, paranoia, or agitation     PHYSICAL EXAMINATION  GENERAL APPEARANCE: Well nourished. In no acute distress. Normal body habitus SKIN:  Skin is warm and dry.  HEAD: Normal in size and contour. No evidence of trauma EYES: Lids open and close normally. No blepharitis, entropion or ectropion. PERRL. Conjunctivae are clear and sclerae are white. Lenses are without opacity EARS: Pinnae are normal. Patient hears normal voice tunes of the examiner MOUTH and THROAT: Lips are without lesions. Oral mucosa is moist and without lesions. Tongue is normal in shape, size, and color and without lesions NECK: supple, trachea midline, no neck masses, no thyroid tenderness, no thyromegaly LYMPHATICS: no LAN in the neck, no supraclavicular LAN RESPIRATORY: breathing is even & unlabored, BS CTAB CARDIAC: RRR, no murmur,no extra heart sounds, LLE 1+ edema GI: abdomen soft, normal BS, no masses, no tenderness, no hepatomegaly, no splenomegaly EXTREMITIES:  Able to move X 4 extremities PSYCHIATRIC: Alert and oriented X 3. Affect and behavior are appropriate    LABS/RADIOLOGY: Labs reviewed: Basic Metabolic Panel:  Recent Labs  14/78/2909/27/17 05/03/16 06/17/16  NA 132* 133* 138  K 4.1 3.6 3.8  BUN 10 11 10   CREATININE 0.5 0.5 0.6   Liver Function Tests:  Recent Labs  04/23/16 05/03/16  AST 19 16  ALT  --  6*  ALKPHOS 38 44   CBC:  Recent Labs  04/28/16 05/03/16  WBC 8.9 8.1  NEUTROABS  --  5  HGB 11.9* 12.8    HCT 34* 36  PLT 238 266       ASSESSMENT/PLAN:  Parkinson's disease -  Continue Sinemet 25-100 mg 1 tab PO 5X/day   Hypothyroidism - recently increased Levothyroxine from 50 mcg to 75 mcg 1 tab PO Q D Lab Results  Component Value Date   TSH 6.38 (A) 06/17/2016    Osteoporosis - Continue Fosamax 70 mg 1 tab PO Q Thursdays and Os-Cal 500 mg  Depression - mood is stable; continue Remeron 15 mg  1 tab PO Q HS  Diabetes mellitus, type 2 - continue Metformin 500 mg 1 tab PO Q AM and 2 tabs PO Q PM;  Lab Results  Component Value Date   HGBA1C 6.5 06/17/2016   Hypertension - well-controlled; continue Hydrochlorothiazide 25 mg daily  Insomnia - continue Melatonin 3 mg 1 tab PO Q HS  Hyperlipidemia - continue Crestor 10 mg 1 tab PO Q  Lab Results  Component Value Date   CHOL 144 06/17/2016   HDL 54 06/17/2016   LDLCALC 69 06/17/2016   TRIG 106 06/17/2016    Restless leg syndrome -  Requip 0.5 mg 1 tab PO TID      Goals of care:  Long-term care    Kenard GowerMonina Medina-Vargas, NP [facility] [phone]

## 2016-07-30 LAB — TSH: TSH: 3.89 u[IU]/mL (ref 0.41–5.90)

## 2016-09-01 ENCOUNTER — Encounter: Payer: Self-pay | Admitting: Adult Health

## 2016-09-01 ENCOUNTER — Non-Acute Institutional Stay (SKILLED_NURSING_FACILITY): Payer: Medicare Other | Admitting: Adult Health

## 2016-09-01 DIAGNOSIS — G2581 Restless legs syndrome: Secondary | ICD-10-CM | POA: Diagnosis not present

## 2016-09-01 DIAGNOSIS — R600 Localized edema: Secondary | ICD-10-CM

## 2016-09-01 DIAGNOSIS — E038 Other specified hypothyroidism: Secondary | ICD-10-CM | POA: Diagnosis not present

## 2016-09-01 DIAGNOSIS — E119 Type 2 diabetes mellitus without complications: Secondary | ICD-10-CM | POA: Diagnosis not present

## 2016-09-01 DIAGNOSIS — I10 Essential (primary) hypertension: Secondary | ICD-10-CM | POA: Diagnosis not present

## 2016-09-01 NOTE — Progress Notes (Signed)
DATE:  09/01/2016   MRN:  [id number]  BIRTHDAY: Dec 18, 1931  Facility:  Nursing Home Location:  [facility]  Nursing Home Room Number: 805-B  LEVEL OF CARE:  SNF [phone](31)  Contact Information    Name Relation Home Work Mobile   [place],Richard Nephew [phone]  [phone]   Drucilla SchmidtBean,Cindy Niece [phone]       Code Status History    This patient does not have a recorded code status. Please follow your organizational policy for patients in this situation.       Chief Complaint  Patient presents with  . Medical Management of Chronic Issues    HISTORY OF PRESENT ILLNESS:  This is an 84-YO female seen for a routine visit.  She is a long-term care resident of [facility]. It has been reported that she has been refusing to take HCTZ.. Latest BP 160/78. She has been noted to have BLE edema 1+.Stressed to resident the importance of taking medications and agreed to take HCTZ.    PAST MEDICAL HISTORY:  Past Medical History:  Diagnosis Date  . Anemia   . B12 deficiency   . Delirium   . Diabetes mellitus without complication (HCC)   . Elevated TSH 06/18/2016  . Generalized weakness   . Hyperlipidemia   . Hypertension   . Idiopathic peripheral neuropathy   . Inability to walk   . Mobility impaired   . Neurologic gait dysfunction   . Parkinson's disease (HCC)      CURRENT MEDICATIONS: Reviewed  Patient's Medications  New Prescriptions   No medications on file  Previous Medications   ACETAMINOPHEN (TYLENOL) 325 MG TABLET    Take 650 mg by mouth every 4 (four) hours as needed for mild pain or moderate pain.   ALENDRONATE (FOSAMAX) 70 MG TABLET    Take 70 mg by mouth once a week. Take with a full glass of water on an empty stomach.  Take on Thursdays.   ASCORBIC ACID (VITAMIN C) 500 MG TABLET    Take 500 mg by mouth daily.   ASPIRIN EC 81 MG TABLET    Take 81 mg by mouth daily.   B COMPLEX VITAMINS CAPSULE     Take 1 capsule by mouth at bedtime.    BIOTIN [id number] MCG TABS    Take 1 tablet by mouth daily.   CALCIUM-VITAMIN D (OSCAL WITH D) 500-200 MG-UNIT TABLET    Take 1 tablet by mouth daily.   CARBIDOPA-LEVODOPA (SINEMET IR) 25-100 MG TABLET    Take 1 tablet by mouth 5 (five) times daily.    CARBOXYMETHYLCELLULOSE (REFRESH PLUS) 0.5 % SOLN    Place 1 drop into both eyes as needed.   CYANOCOBALAMIN (,VITAMIN B-12,) 1000 MCG/ML INJECTION    Inject 1,000 mcg into the muscle every 30 (thirty) days. Give 1 mL into the muscle   DULOXETINE (CYMBALTA) 60 MG CAPSULE    Take 60 mg by mouth daily.   HYDROCHLOROTHIAZIDE (HYDRODIURIL) 25 MG TABLET    Take 25 mg by mouth daily as needed. Hold for SBP less than 110   LEVOTHYROXINE (SYNTHROID, LEVOTHROID) 75 MCG TABLET    Take 75 mcg by mouth daily before breakfast.   MELATONIN 3 MG TABS    Take 1 tablet by mouth at bedtime.   METFORMIN (GLUCOPHAGE) 500 MG TABLET    Take 1,000 mg by mouth every evening.    METFORMIN (GLUCOPHAGE) 500 MG TABLET    Take 500  mg by mouth daily with breakfast.    MIRTAZAPINE (REMERON) 15 MG TABLET    Take 15 mg by mouth at bedtime.    ROPINIROLE (REQUIP) 0.5 MG TABLET    Take 0.5 mg by mouth 3 (three) times daily.    ROSUVASTATIN (CRESTOR) 10 MG TABLET    Take 10 mg by mouth daily.   THIAMINE 100 MG TABLET    Take 100 mg by mouth daily.   TRAMADOL (ULTRAM) 50 MG TABLET    Take 50 mg by mouth every 6 (six) hours as needed for moderate pain.  Modified Medications   No medications on file  Discontinued Medications   BIOTIN 16109 MCG TABS    Take 1 tablet by mouth daily.   METHYLFOL-METHYLCOB-ACETYLCYST (CEREFOLIN NAC) 6-2-600 MG TABS    Take 1 tablet by mouth at bedtime.     No Known Allergies   REVIEW OF SYSTEMS:  GENERAL: no change in appetite, no fatigue, no weight changes, no fever, chills or weakness SKIN: Denies rash, itching, wounds, ulcer sores, or nail abnormality EYES: Denies change in vision, dry eyes, eye pain,  itching or discharge EARS: Denies change in hearing, ringing in ears, or earache NOSE: Denies nasal congestion or epistaxis MOUTH and THROAT: Denies oral discomfort, gingival pain or bleeding, pain from teeth or hoarseness   RESPIRATORY: no cough, SOB, DOE, wheezing, hemoptysis CARDIAC: no chest pain, or palpitations, +edema GI: no abdominal pain, diarrhea, constipation, heart burn, nausea or vomiting GU: Denies dysuria, frequency, hematuria, incontinence, or discharge PSYCHIATRIC: Denies feeling of depression or anxiety. No report of hallucinations, insomnia, paranoia, or agitation    PHYSICAL EXAMINATION  GENERAL APPEARANCE: Well nourished. In no acute distress. Normal body habitus SKIN:  Skin is warm and dry. There are no suspicious lesions or rash HEAD: Normal in size and contour. No evidence of trauma EYES: Lids open and close normally. No blepharitis, entropion or ectropion. PERRL. Conjunctivae are clear and sclerae are white. Lenses are without opacity EARS: Pinnae are normal. Patient hears normal voice tunes of the examiner MOUTH and THROAT: Lips are without lesions. Oral mucosa is moist and without lesions. Tongue is normal in shape, size, and color and without lesions NECK: supple, trachea midline, no neck masses, no thyroid tenderness, no thyromegaly LYMPHATICS: no LAN in the neck, no supraclavicular LAN RESPIRATORY: breathing is even & unlabored, BS CTAB CARDIAC: RRR, no murmur,no extra heart sounds, BLE 1+ edema GI: abdomen soft, normal BS, no masses, no tenderness, no hepatomegaly, no splenomegaly PSYCHIATRIC: Alert and oriented X 3. Affect and behavior are appropriate    LABS/RADIOLOGY: Labs reviewed: Basic Metabolic Panel:  Recent Labs  60/45/40 05/03/16 06/17/16  NA 132* 133* 138  K 4.1 3.6 3.8  BUN 10 11 10   CREATININE 0.5 0.5 0.6   Liver Function Tests:  Recent Labs  04/23/16 05/03/16  AST 19 16  ALT  --  6*  ALKPHOS 38 44    CBC:  Recent Labs   04/28/16 05/03/16  WBC 8.9 8.1  NEUTROABS  --  5  HGB 11.9* 12.8  HCT 34* 36  PLT 238 266   Lipid Panel:  Recent Labs  06/17/16  HDL 54    ASSESSMENT/PLAN:  Bilateral lower extremity edema - encourage to take  HCTZ and has agreed to take HCTZ; continue to assist in donning bilateral knee high teds  Hypothyroidism - continue Levothyroxine 75 mcg 1 tab PO Q D Lab Results  Component Value Date   TSH 3.89 07/30/2016  Diabetes mellitus, type 2 - continue Metformin 500 mg 1 tab PO Q AM and 2 tabs PO Q PM; check for urine microalbumin Lab Results  Component Value Date   HGBA1C 6.5 06/17/2016   Hypertension -  Has agreed to take HCTZ 25 mg PO Q D; BP/HR BID X 1 week  Restless leg syndrome -  Requip 0.5 mg 1 tab PO TID    Goals of care:  Long-term care    Teague Goynes C. Medina-Vargas - NP [employer] [phone]

## 2016-09-02 LAB — BASIC METABOLIC PANEL
BUN: 12 mg/dL (ref 4–21)
CREATININE: 0.6 mg/dL (ref 0.5–1.1)
Glucose: 132 mg/dL
Potassium: 4 mmol/L (ref 3.4–5.3)
Sodium: 132 mmol/L — AB (ref 137–147)

## 2016-09-02 LAB — MICROALBUMIN, URINE

## 2016-09-10 LAB — BASIC METABOLIC PANEL
BUN: 15 mg/dL (ref 4–21)
CREATININE: 0.7 mg/dL (ref 0.5–1.1)
GLUCOSE: 167 mg/dL
POTASSIUM: 4 mmol/L (ref 3.4–5.3)
SODIUM: 134 mmol/L — AB (ref 137–147)

## 2016-10-01 ENCOUNTER — Encounter: Payer: Self-pay | Admitting: Adult Health

## 2016-10-01 ENCOUNTER — Non-Acute Institutional Stay (SKILLED_NURSING_FACILITY): Payer: Medicare Other | Admitting: Adult Health

## 2016-10-01 DIAGNOSIS — E538 Deficiency of other specified B group vitamins: Secondary | ICD-10-CM

## 2016-10-01 DIAGNOSIS — F329 Major depressive disorder, single episode, unspecified: Secondary | ICD-10-CM | POA: Diagnosis not present

## 2016-10-01 DIAGNOSIS — I10 Essential (primary) hypertension: Secondary | ICD-10-CM

## 2016-10-01 DIAGNOSIS — M810 Age-related osteoporosis without current pathological fracture: Secondary | ICD-10-CM | POA: Diagnosis not present

## 2016-10-01 DIAGNOSIS — E119 Type 2 diabetes mellitus without complications: Secondary | ICD-10-CM | POA: Diagnosis not present

## 2016-10-01 NOTE — Progress Notes (Signed)
DATE:  10/01/2016   MRN:  [id number]  BIRTHDAY: [birth year]  Facility:  Nursing Home Location:  [facility]  Nursing Home Room Number: 805-B  LEVEL OF CARE:  SNF [phone](31)  Contact Information    Name Relation Home Work Mobile   [place] Nephew [phone]  [phone]   Drucilla SchmidtBean,Cindy Niece [phone]       Code Status History    This patient does not have a recorded code status. Please follow your organizational policy for patients in this situation.       Chief Complaint  Patient presents with  . Medical Management of Chronic Issues    HISTORY OF PRESENT ILLNESS:  This is an 84-YO female seen for a routine visit.  [facility]. She was seen sitting in the dining room. No CBG logs has been seen for review. BPs are 118/59, 118/60, 148/76, 120/60.   PAST MEDICAL HISTORY:  Past Medical History:  Diagnosis Date  . Anemia   . B12 deficiency   . Delirium   . Diabetes mellitus without complication (HCC)   . Elevated TSH 06/18/2016  . Generalized weakness   . Hyperlipidemia   . Hypertension   . Idiopathic peripheral neuropathy   . Inability to walk   . Mobility impaired   . Neurologic gait dysfunction   . Parkinson's disease (HCC)      CURRENT MEDICATIONS: Reviewed  Patient's Medications  New Prescriptions   No medications on file  Previous Medications   ACETAMINOPHEN (TYLENOL) 325 MG TABLET    Take 650 mg by mouth every 4 (four) hours as needed for mild pain or moderate pain.   ALENDRONATE (FOSAMAX) 70 MG TABLET    Take 70 mg by mouth once a week. Take with a full glass of water on an empty stomach.  Take on Thursdays.   ASCORBIC ACID (VITAMIN C) 500 MG TABLET    Take 500 mg by mouth daily.   ASPIRIN EC 81 MG TABLET    Take 81 mg by mouth daily.   B COMPLEX VITAMINS CAPSULE    Take 1 capsule by mouth at bedtime.    BIOTIN 3875610000 MCG TABS    Take 1 tablet by  mouth daily.   CALCIUM-VITAMIN D (OSCAL WITH D) 500-200 MG-UNIT TABLET    Take 1 tablet by mouth daily.   CARBIDOPA-LEVODOPA (SINEMET IR) 25-100 MG TABLET    Take 1 tablet by mouth 5 (five) times daily.    CARBOXYMETHYLCELLULOSE (REFRESH PLUS) 0.5 % SOLN    Place 1 drop into both eyes as needed.   CYANOCOBALAMIN (,VITAMIN B-12,) 1000 MCG/ML INJECTION    Inject 1,000 mcg into the muscle every 30 (thirty) days. Give 1 mL into the muscle   DULOXETINE (CYMBALTA) 60 MG CAPSULE    Take 60 mg by mouth daily.   HYDROCHLOROTHIAZIDE (HYDRODIURIL) 25 MG TABLET    Take 25 mg by mouth daily as needed. Hold for SBP less than 110   LEVOTHYROXINE (SYNTHROID, LEVOTHROID) 75 MCG TABLET    Take 75 mcg by mouth daily before breakfast.   MELATONIN 3 MG TABS    Take 1 tablet by mouth at bedtime.   METFORMIN (GLUCOPHAGE) 500 MG TABLET    Take 1,000 mg by mouth every evening.    METFORMIN (GLUCOPHAGE) 500 MG TABLET    Take 500 mg by mouth daily with breakfast.    MIRTAZAPINE (REMERON) 15 MG TABLET  Take 15 mg by mouth at bedtime.    ROPINIROLE (REQUIP) 0.5 MG TABLET    Take 0.5 mg by mouth 3 (three) times daily.    ROSUVASTATIN (CRESTOR) 10 MG TABLET    Take 10 mg by mouth daily.   THIAMINE 100 MG TABLET    Take 100 mg by mouth daily.   TRAMADOL (ULTRAM) 50 MG TABLET    Take 50 mg by mouth every 6 (six) hours as needed for moderate pain.  Modified Medications   No medications on file  Discontinued Medications   No medications on file     No Known Allergies   REVIEW OF SYSTEMS:  GENERAL: no change in appetite, no fatigue, no weight changes, no fever, chills or weakness EYES: Denies change in vision, dry eyes, eye pain, itching or discharge EARS: Denies change in hearing, ringing in ears, or earache NOSE: Denies nasal congestion or epistaxis MOUTH and THROAT: Denies oral discomfort, gingival pain or bleeding, pain from teeth or hoarseness   RESPIRATORY: no cough, SOB, DOE, wheezing, hemoptysis CARDIAC: no  chest pain, edema or palpitations GI: no abdominal pain, diarrhea, constipation, heart burn, nausea or vomiting GU: Denies dysuria, frequency, hematuria, incontinence, or discharge PSYCHIATRIC: Denies feeling of depression or anxiety. No report of hallucinations, insomnia, paranoia, or agitation    PHYSICAL EXAMINATION  GENERAL APPEARANCE: Well nourished. In no acute distress. Obese SKIN:  Skin is warm and dry.  HEAD: Normal in size and contour. No evidence of trauma EYES: Lids open and close normally. No blepharitis, entropion or ectropion. PERRL. Conjunctivae are clear and sclerae are white. Lenses are without opacity EARS: Pinnae are normal. Patient hears normal voice tunes of the examiner MOUTH and THROAT: Lips are without lesions. Oral mucosa is moist and without lesions. Tongue is normal in shape, size, and color and without lesions NECK: supple, trachea midline, no neck masses, no thyroid tenderness, no thyromegaly LYMPHATICS: no LAN in the neck, no supraclavicular LAN RESPIRATORY: breathing is even & unlabored, BS CTAB CARDIAC: RRR, no murmur,no extra heart sounds, no edema GI: abdomen soft, normal BS, no masses, no tenderness, no hepatomegaly, no splenomegaly EXTREMITIES:  Able to move 4 extremities PSYCHIATRIC: Alert and oriented X 3. Affect and behavior are appropriate    LABS/RADIOLOGY: Labs reviewed: Basic Metabolic Panel:  Recent Labs  16/10/96 09/02/16 09/10/16  NA 138 132* 134*  K 3.8 4.0 4.0  BUN 10 12 15   CREATININE 0.6 0.6 0.7   Liver Function Tests:  Recent Labs  04/23/16 05/03/16  AST 19 16  ALT  --  6*  ALKPHOS 38 44   CBC:  Recent Labs  04/28/16 05/03/16  WBC 8.9 8.1  NEUTROABS  --  5  HGB 11.9* 12.8  HCT 34* 36  PLT 238 266   Lipid Panel:  Recent Labs  06/17/16  HDL 54    ASSESSMENT/PLAN:  Diabetes mellitus, type 2 - continue Metformin 500 mg 1 tab PO Q AM and 2 tabs PO Q PM; check hgbA1c and CBG BID  Hypertension -   Well-controlled; continue HCTZ 25 mg PO Q D.   Depression - mood is well controlled; continue Cymbalta 60 mg 1 capsule by mouth daily  Vitamin B12 deficiency - continue cyanocobalamin 1000 g/ML IM every 30 days  Osteoporosis - continue Fosamax 70 mg 1 tab by mouth daily Thursdays; fall precautions    Goals of care:  Long-term care    Wesly Whisenant C. Medina-Vargas - NP [employer] [employer] [phone]

## 2016-10-04 LAB — HEMOGLOBIN A1C: HEMOGLOBIN A1C: 6.2

## 2016-11-03 ENCOUNTER — Non-Acute Institutional Stay (SKILLED_NURSING_FACILITY): Payer: Medicare Other | Admitting: Internal Medicine

## 2016-11-03 ENCOUNTER — Encounter: Payer: Self-pay | Admitting: Internal Medicine

## 2016-11-03 DIAGNOSIS — R4189 Other symptoms and signs involving cognitive functions and awareness: Secondary | ICD-10-CM | POA: Diagnosis not present

## 2016-11-03 DIAGNOSIS — R682 Dry mouth, unspecified: Secondary | ICD-10-CM | POA: Diagnosis not present

## 2016-11-03 DIAGNOSIS — E785 Hyperlipidemia, unspecified: Secondary | ICD-10-CM

## 2016-11-03 DIAGNOSIS — E119 Type 2 diabetes mellitus without complications: Secondary | ICD-10-CM | POA: Insufficient documentation

## 2016-11-03 DIAGNOSIS — M810 Age-related osteoporosis without current pathological fracture: Secondary | ICD-10-CM

## 2016-11-03 DIAGNOSIS — E871 Hypo-osmolality and hyponatremia: Secondary | ICD-10-CM | POA: Diagnosis not present

## 2016-11-03 DIAGNOSIS — E039 Hypothyroidism, unspecified: Secondary | ICD-10-CM

## 2016-11-03 DIAGNOSIS — I10 Essential (primary) hypertension: Secondary | ICD-10-CM

## 2016-11-03 NOTE — Progress Notes (Signed)
LOCATION: [facility]  PCP: No primary care provider on file.   Code Status: Full Code  Goals of care: Advanced Directive information No flowsheet data found.     Extended Emergency Contact Information Primary Emergency Contact: [place] Home Phone: [phone] Mobile Phone: [phone] Relation: Nephew Secondary Emergency Contact: Bean,Cindy Address: [address]          [place], [place] [address] [place] Home Phone: [phone] Work Phone: [phone] Mobile Phone: [phone] Relation: Niece   Allergies  Allergen Reactions  . Codeine     Chief Complaint  Patient presents with  . Medical Management of Chronic Issues    Routine Visit      HPI:  Patient is a 83 y.o. female seen today for routine visit. She complaints of dry mouth. She has been at her baseline otherwise. She has been compliant with her medication. She feeds herself and is out of bed daily. She propels herself on a wheelchair. No fall has been reported. No pressure ulcer has been reported. No acute behavior change has been reported. Per staff, they are times when she appears to be pleasantly confused. She recently completed antibiotic rx for UTI.   Review of Systems:   Constitutional: Negative for fever, chills. HENT: Negative for congestion, hearing loss and sore throat.   Eyes: Negative for blurred vision, double vision and discharge.  Respiratory: Negative for cough, shortness of breath and wheezing.   Cardiovascular: Negative for chest pain, palpitations Gastrointestinal: Negative for heartburn, nausea, vomiting, abdominal pain. She had a bowel movement today.  Genitourinary: Negative for dysuria.  Musculoskeletal: Negative for back pain, fall.  Skin: Negative for itching and rash.  Neurological: negative for dizziness and headaches.  Psychiatric/Behavioral: Negative for depression   Past Medical History:  Diagnosis Date  . Anemia     . B12 deficiency   . Delirium   . Diabetes mellitus without complication (HCC)   . Elevated TSH 06/18/2016  . Generalized weakness   . Hyperlipidemia   . Hypertension   . Idiopathic peripheral neuropathy   . Inability to walk   . Mobility impaired   . Neurologic gait dysfunction   . Parkinson's disease [facility])    Past Surgical History:  Procedure Laterality Date  . APPENDECTOMY     Social History:   reports that she has never smoked. She has never used smokeless tobacco. She reports that she does not drink alcohol. Her drug history is not on file.  Family History  Problem Relation Age of Onset  . Hypertension Mother   . Hypertension Father   . Heart disease Father   . Heart disease Sister   . Diabetes Sister   . Cancer Sister   . Cancer Brother     Medications: Allergies as of 11/03/2016      Reactions   Codeine       Medication List       Accurate as of 11/03/16 12:07 PM. Always use your most recent med list.          acetaminophen 325 MG tablet Commonly known as:  TYLENOL Take 650 mg by mouth every 4 (four) hours as needed for mild pain or moderate pain.   alendronate 70 MG tablet Commonly known as:  FOSAMAX Take 70 mg by mouth once a week. Take with a full glass of water on an empty stomach.  Take on Thursdays.   ascorbic acid 500 MG tablet Commonly known as:  VITAMIN C Take 500 mg by mouth daily.   aspirin EC 81 MG tablet Take 81 mg by mouth daily.   b complex vitamins capsule Take 1 capsule by mouth at bedtime.   Biotin [address] MCG Tabs Take 1 tablet by mouth daily.   calcium-vitamin D 500-200 MG-UNIT tablet Commonly known as:  OSCAL WITH D Take 1 tablet by mouth daily.   carbidopa-levodopa 25-100 MG tablet Commonly known as:  SINEMET IR Take 1 tablet by mouth 5 (five) times daily.   carboxymethylcellulose 0.5 % Soln Commonly known as:  REFRESH PLUS Place 1 drop into both eyes as needed.   cyanocobalamin 1000 MCG/ML injection Commonly  known as:  (VITAMIN B-12) Inject 1,000 mcg into the muscle every 30 (thirty) days. Give 1 mL into the muscle   DULoxetine 60 MG capsule Commonly known as:  CYMBALTA Take 60 mg by mouth daily.   hydrochlorothiazide 25 MG tablet Commonly known as:  HYDRODIURIL Take 25 mg by mouth daily. Hold for SBP less than 110   levothyroxine 75 MCG tablet Commonly known as:  SYNTHROID, LEVOTHROID Take 75 mcg by mouth daily before breakfast.   Melatonin 3 MG Tabs Take 1 tablet by mouth at bedtime.   metFORMIN 500 MG tablet Commonly known as:  GLUCOPHAGE Take 1,000 mg by mouth every evening.   metFORMIN 500 MG tablet Commonly known as:  GLUCOPHAGE Take 500 mg by mouth daily with breakfast.   mirtazapine 15 MG tablet Commonly known as:  REMERON Take 15 mg by mouth at bedtime.   rOPINIRole 0.5 MG tablet Commonly known as:  REQUIP Take 0.5 mg by mouth 3 (three) times daily.   rosuvastatin 10 MG tablet Commonly known as:  CRESTOR Take 10 mg by mouth daily.   thiamine 100 MG tablet Take 100 mg by mouth daily.   traMADol 50 MG tablet Commonly known as:  ULTRAM Take 50 mg by mouth every 6 (six) hours as needed for moderate pain.       Immunizations:  There is no immunization history on file for this patient.   Physical Exam:  Vitals:   11/03/16 1149  BP: 133/69  Pulse: 80  Resp: 18  Temp: 97.9 F (36.6 C)  TempSrc: Oral  SpO2: 98%  Weight: 174 lb 4.8 oz (79.1 kg)  Height:  (1.549 m)   Body mass index is 32.93 kg/m.  Wt Readings from Last 3 Encounters:  11/03/16 174 lb 4.8 oz (79.1 kg)  10/01/16 176 lb 3.2 oz (79.9 kg)  09/01/16 150 lb (68 kg)    General- elderly female, frail, in no acute distress Head- normocephalic, atraumatic Nose- no maxillary or frontal sinus tenderness, no nasal discharge Throat- dry mucus membrane Eyes- PERRLA, EOMI, no pallor, no icterus, no discharge, normal conjunctiva, normal sclera Neck- no cervical  lymphadenopathy Cardiovascular- normal s1,s2, no murmur, no leg edema Respiratory- bilateral clear to auscultation, no wheeze, no rhonchi, no crackles, no use of accessory muscles Abdomen- bowel sounds present, soft, non tender Musculoskeletal- able to move all 4 extremities, generalized weakness, on wheelchair Neurological- alert and oriented to person and place but not to time Skin- warm and dry Psychiatry- normal mood and affect    Labs reviewed: Basic Metabolic Panel:  Recent Labs  60/45/40 09/02/16 09/10/16  NA 138 132* 134*  K 3.8 4.0 4.0  BUN CREATININE 0.6 0.6 0.7   Liver Function Tests:  Recent Labs  04/23/16 05/03/16  AST 19 16  ALT  --  6*  ALKPHOS 38 44   No results for input(s): LIPASE, AMYLASE in the last 8760 hours. No results for input(s): AMMONIA in the last 8760 hours. CBC:  Recent Labs  04/28/16 05/03/16  WBC 8.9 8.1  NEUTROABS  --  5  HGB 11.9* 12.8  HCT 34* 36  PLT 238 266    09/02/16 Microalbumin w/o creatinine, Urine < 4.0 mg/L    Radiological Exams: MRI Brain Wo Contrast 04/23/16  Impression. Brain: No acute infarction, hemorrhage, hydrocephalus, extra-axial collection or mass lesion. Mild chronic microvascular ischemic changes and parenchymal volume loss. Vascular: normal flow voids. Skull and upper cervical spine: Normal marrow signal. Sinuses/orbits: Negative. Bilateral intra-ocular lens replacements.   Assessment/Plan  Dry mouth Likely from side effects from her medication. Start biotene mouth rinse 15 ml tid and reassess. Encouraged to avoid caffeine and to drink more water.   Senile osteoporosis No recent fall or fracture reported. Continue alendronate 70 mg once a week for now and monitor. Continue oscal supplement.   Type 2 diabetes mellitus without complication Lab Results  Component Value Date   HGBA1C 6.2 10/04/2016  Hemoglobin A1c is suggestive of controlled diabetes. Reviewed blood sugar readings with most of  the readings below 150. currently metformin 500 mg am and 1000 mg pm daily. Given her age and controlled sugar reading, change her metformin to 500 mg twice daily and monitor. Negative for microalbuminuria. Continue aspirin and statin. Needs yearly podiatry and ophthalmology visit. Check a1c 01/04/17  Hyperlipidemia Lipid Panel     Component Value Date/Time   CHOL 144 06/17/2016   TRIG 106 06/17/2016   HDL 54 06/17/2016   LDLCALC 69 06/17/2016   LDL at goal. Continue rosuvastatin 10 mg daily. Check lipid panel  Hypothyroidism Lab Results  Component Value Date   TSH 3.89 07/30/2016   Stable, continue levothyroxine 75 mcg daily  Cognitive impairment Check BIMs. Her hypothyroidism ad parkinson's along with b12 def could be contributing to this. will make neurology follow up  Parkinson's disease Continue sinemet 25-100 mg five times a day. Continue requip. Fall precautions. Make Neurology follow up. Supportive care.   Essential HTN Currently on HCTZ. She has hyponatremia, hctz likely contributing to both. D/c hctz and start her on lisinopril 5 mg daily. Check bp.   Hyponatremia Monitor bmp with her off hydrochlorothiazide.    Labs/tests ordered: a1c, lipid panel 01/04/17   Family/ staff Communication: reviewed care plan with patient and nursing supervisor    Oneal Grout, MD Internal Medicine [facility] [address] [place], [place] [address] Cell Phone (Monday-Friday 8 am - 5 pm): [phone] On Call: [phone] and follow prompts after 5 pm and on weekends Office Phone: [phone] Office Fax: [phone]

## 2016-11-08 LAB — VITAMIN B12: Vitamin B-12: 437

## 2016-11-08 LAB — BASIC METABOLIC PANEL
BUN: 17 mg/dL (ref 4–21)
CREATININE: 0.7 mg/dL (ref 0.5–1.1)
GLUCOSE: 94 mg/dL
POTASSIUM: 4.5 mmol/L (ref 3.4–5.3)
SODIUM: 139 mmol/L (ref 137–147)

## 2016-11-30 ENCOUNTER — Encounter: Payer: Self-pay | Admitting: Adult Health

## 2016-11-30 ENCOUNTER — Non-Acute Institutional Stay (SKILLED_NURSING_FACILITY): Payer: Medicare Other | Admitting: Adult Health

## 2016-11-30 DIAGNOSIS — G20A1 Parkinson's disease without dyskinesia, without mention of fluctuations: Secondary | ICD-10-CM

## 2016-11-30 DIAGNOSIS — E039 Hypothyroidism, unspecified: Secondary | ICD-10-CM | POA: Diagnosis not present

## 2016-11-30 DIAGNOSIS — G20 Parkinson's disease: Secondary | ICD-10-CM

## 2016-11-30 DIAGNOSIS — I10 Essential (primary) hypertension: Secondary | ICD-10-CM

## 2016-11-30 DIAGNOSIS — G2581 Restless legs syndrome: Secondary | ICD-10-CM

## 2016-11-30 NOTE — Progress Notes (Signed)
DATE:  11/30/2016   MRN:  [id number]  BIRTHDAY: 1932/02/26  Facility:  Nursing Home Location:  [facility]  Nursing Home Room Number: 805-B  LEVEL OF CARE:  SNF [phone])  Contact Information    Name Relation Home Work Mobile   [place] [phone]  [phone]   Drucilla Schmidt Niece [phone]       Code Status History    This patient does not have a recorded code status. Please follow your organizational policy for patients in this situation.       Chief Complaint  Patient presents with  . Medical Management of Chronic Issues    HISTORY OF PRESENT ILLNESS:  This is an 84-YO female seen for a routine visit.  She is a long-term care resident of [facility]. She was seen in the room today and did not verbalize any concerns.     PAST MEDICAL HISTORY:  Past Medical History:  Diagnosis Date  . Anemia   . B12 deficiency   . Delirium   . Diabetes mellitus without complication (HCC)   . Elevated TSH 06/18/2016  . Generalized weakness   . Hyperlipidemia   . Hypertension   . Idiopathic peripheral neuropathy   . Inability to walk   . Mobility impaired   . Neurologic gait dysfunction   . Parkinson's disease (HCC)      CURRENT MEDICATIONS: Reviewed  Patient's Medications  New Prescriptions   No medications on file  Previous Medications   ACETAMINOPHEN (TYLENOL) 325 MG TABLET    Take 650 mg by mouth every 4 (four) hours as needed for mild pain or moderate pain.   ALENDRONATE (FOSAMAX) 70 MG TABLET    Take 70 mg by mouth once a week. Take with a full glass of water on an empty stomach.  Take on Thursdays.   ASCORBIC ACID (VITAMIN C) 500 MG TABLET    Take 500 mg by mouth daily.   ASPIRIN EC 81 MG TABLET    Take 81 mg by mouth daily.   B COMPLEX VITAMINS CAPSULE    Take 1 capsule by mouth at bedtime.    CALCIUM-VITAMIN D (OSCAL WITH D) 500-200 MG-UNIT TABLET    Take 1 tablet by mouth daily.   CARBIDOPA-LEVODOPA (SINEMET IR) 25-100 MG TABLET    Take 1 tablet by mouth 5 (five) times daily.    CARBOXYMETHYLCELLULOSE (REFRESH PLUS) 0.5 % SOLN    Place 1 drop into both eyes as needed.   CYANOCOBALAMIN (,VITAMIN B-12,) 1000 MCG/ML INJECTION    Inject 1,000 mcg into the muscle every 30 (thirty) days. Give 1 mL into the muscle   DULOXETINE (CYMBALTA) 60 MG CAPSULE    Take 60 mg by mouth daily.   LEVOTHYROXINE (SYNTHROID, LEVOTHROID) 75 MCG TABLET    Take 75 mcg by mouth daily before breakfast.   LISINOPRIL (PRINIVIL,ZESTRIL) 5 MG TABLET    Take 5 mg by mouth daily. Hold for SBP <110   MELATONIN 3 MG TABS    Take 1 tablet by mouth at bedtime.   METFORMIN (GLUCOPHAGE) 500 MG TABLET    Take 500 mg by mouth 2 (two) times daily with a meal.    MIRTAZAPINE (REMERON) 7.5 MG TABLET    Take 7.5 mg by mouth at bedtime.   ROPINIROLE (REQUIP) 0.5 MG TABLET    Take 0.5 mg by mouth 3 (three) times daily.    ROSUVASTATIN (CRESTOR) 10 MG TABLET    Take 10  mg by mouth daily.   THIAMINE 100 MG TABLET    Take 100 mg by mouth daily.   TRAMADOL (ULTRAM) 50 MG TABLET    Take 50 mg by mouth every 6 (six) hours as needed for moderate pain.  Modified Medications   No medications on file  Discontinued Medications   BIOTIN [address] MCG TABS    Take 1 tablet by mouth daily.   HYDROCHLOROTHIAZIDE (HYDRODIURIL) 25 MG TABLET    Take 25 mg by mouth daily. Hold for SBP less than 110   METFORMIN (GLUCOPHAGE) 500 MG TABLET    Take 1,000 mg by mouth every evening.    MIRTAZAPINE (REMERON) 15 MG TABLET    Take 15 mg by mouth at bedtime.      Allergies  Allergen Reactions  . Codeine      REVIEW OF SYSTEMS:  GENERAL: no change in appetite, no fatigue, no weight changes, no fever, chills or weakness EYES: Denies change in vision, dry eyes, eye pain, itching or discharge EARS: Denies change in hearing, ringing in ears, or earache NOSE: Denies nasal congestion or epistaxis MOUTH and THROAT: Denies oral discomfort,  gingival pain or bleeding, pain from teeth or hoarseness   RESPIRATORY: no cough, SOB, DOE, wheezing, hemoptysis CARDIAC: no chest pain or palpitations GI: no abdominal pain, diarrhea, constipation, heart burn, nausea or vomiting GU: Denies dysuria, frequency, hematuria, incontinence, or discharge PSYCHIATRIC: Denies feeling of depression or anxiety. No report of hallucinations, insomnia, paranoia, or agitation     PHYSICAL EXAMINATION  GENERAL APPEARANCE: Well nourished. In no acute distress. Obese SKIN:  Skin is warm and dry.  HEAD: Normal in size and contour. No evidence of trauma EYES: Lids open and close normally. No blepharitis, entropion or ectropion. PERRL. Conjunctivae are clear and sclerae are white. Lenses are without opacity EARS: Pinnae are normal. Patient hears normal voice tunes of the examiner MOUTH and THROAT: Lips are without lesions. Oral mucosa is moist and without lesions. Tongue is normal in shape, size, and color and without lesions NECK: supple, trachea midline, no neck masses, no thyroid tenderness, no thyromegaly LYMPHATICS: no LAN in the neck, no supraclavicular LAN RESPIRATORY: breathing is even & unlabored, BS CTAB CARDIAC: RRR, no murmur,no extra heart sounds, BLE 1+ edema GI: abdomen soft, normal BS, no masses, no tenderness, no hepatomegaly, no splenomegaly EXTREMITIES:  Able to move X 4 extremities PSYCHIATRIC: Alert and oriented X 3. Affect and behavior are appropriate   LABS/RADIOLOGY: Labs reviewed: Basic Metabolic Panel:  Recent Labs  60/45/40 09/02/16 09/10/16  NA 138 132* 134*  K 3.8 4.0 4.0  BUN CREATININE 0.6 0.6 0.7   Liver Function Tests:  Recent Labs  04/23/16 05/03/16  AST 19 16  ALT  --  6*  ALKPHOS 38 44   CBC:  Recent Labs  04/28/16 05/03/16  WBC 8.9 8.1  NEUTROABS  --  5  HGB 11.9* 12.8  HCT 34* 36  PLT 238 266   Lipid Panel:  Recent Labs  06/17/16  HDL 54    ASSESSMENT/PLAN:  Essential  hypertension - well-controlled; continue Lisinopril 5 mg 1 tab PO Q D  Restless leg syndrome - continue Requip 0.5 mg 1 tab PO TID  Hypothyroidism - continue Levothyroxine 75 mcg 1 tab PO D Lab Results  Component Value Date   TSH 3.89 07/30/2016   Parkinson's disease - continue Sinemet 25-100 mg 1 tab PO 5 X/day     Goals of care:  Long-term care    Monina C. Medina-Vargas - NP    [employer] [employer] [phone]

## 2016-12-21 ENCOUNTER — Non-Acute Institutional Stay (SKILLED_NURSING_FACILITY): Payer: Medicare Other

## 2016-12-21 DIAGNOSIS — Z0000 Encounter for general adult medical examination without abnormal findings: Secondary | ICD-10-CM

## 2016-12-21 NOTE — Progress Notes (Signed)
Subjective:   Brandy Martinez is a 83 y.o. female who presents for an Initial Medicare Annual Wellness Visit at [facility]        Objective:    Today's Vitals   12/21/16 1135  BP: (!) 100/52  Pulse: 82  Temp: 97.5 F (36.4 C)  TempSrc: Oral  SpO2: 98%  Weight: 174 lb (78.9 kg)  Height: 5\' 1"  (1.549 m)   Body mass index is 32.88 kg/m.   Current Medications (verified) Outpatient Encounter Prescriptions as of 12/21/2016  Medication Sig  . acetaminophen (TYLENOL) 325 MG tablet Take 650 mg by mouth every 4 (four) hours as needed for mild pain or moderate pain.  Marland Kitchen. alendronate (FOSAMAX) 70 MG tablet Take 70 mg by mouth once a week. Take with a full glass of water on an empty stomach.  Take on Thursdays.  Marland Kitchen. ascorbic acid (VITAMIN C) 500 MG tablet Take 500 mg by mouth daily.  Marland Kitchen. aspirin EC 81 MG tablet Take 81 mg by mouth daily.  Marland Kitchen. b complex vitamins capsule Take 1 capsule by mouth at bedtime.   . calcium-vitamin D (OSCAL WITH D) 500-200 MG-UNIT tablet Take 1 tablet by mouth daily.  . carbidopa-levodopa (SINEMET IR) 25-100 MG tablet Take 1 tablet by mouth 5 (five) times daily.   . carboxymethylcellulose (REFRESH PLUS) 0.5 % SOLN Place 1 drop into both eyes as needed.  . cyanocobalamin (,VITAMIN B-12,) 1000 MCG/ML injection Inject 1,000 mcg into the muscle every 30 (thirty) days. Give 1 mL into the muscle  . DULoxetine (CYMBALTA) 60 MG capsule Take 60 mg by mouth daily.  Marland Kitchen. levothyroxine (SYNTHROID, LEVOTHROID) 75 MCG tablet Take 75 mcg by mouth daily before breakfast.  . lisinopril (PRINIVIL,ZESTRIL) 5 MG tablet Take 5 mg by mouth daily. Hold for SBP <110  . Melatonin 3 MG TABS Take 1 tablet by mouth at bedtime.  . metFORMIN (GLUCOPHAGE) 500 MG tablet Take 500 mg by mouth 2 (two) times daily with a meal.   . mirtazapine (REMERON) 7.5 MG tablet Take 7.5 mg by mouth at bedtime.  Marland Kitchen. rOPINIRole (REQUIP) 0.5 MG tablet Take 0.5 mg by mouth 3 (three) times daily.   .  rosuvastatin (CRESTOR) 10 MG tablet Take 10 mg by mouth daily.  Marland Kitchen. thiamine 100 MG tablet Take 100 mg by mouth daily.  . traMADol (ULTRAM) 50 MG tablet Take 50 mg by mouth every 6 (six) hours as needed for moderate pain.   No facility-administered encounter medications on file as of 12/21/2016.     Allergies (verified) Codeine   History: Past Medical History:  Diagnosis Date  . Anemia   . B12 deficiency   . Delirium   . Diabetes mellitus without complication (HCC)   . Elevated TSH 06/18/2016  . Generalized weakness   . Hyperlipidemia   . Hypertension   . Idiopathic peripheral neuropathy   . Inability to walk   . Mobility impaired   . Neurologic gait dysfunction   . Parkinson's disease [facility](HCC)    Past Surgical History:  Procedure Laterality Date  . APPENDECTOMY     Family History  Problem Relation Age of Onset  . Hypertension Mother   . Hypertension Father   . Heart disease Father   . Heart disease Sister   . Diabetes Sister   . Cancer Sister   . Cancer Brother    Social History   Occupational History  . Not on file.   Social History Main Topics  .  Smoking status: Never Smoker  . Smokeless tobacco: Never Used  . Alcohol use No  . Drug use: No  . Sexual activity: Not on file    Tobacco Counseling Counseling given: Not Answered   Activities of Daily Living In your present state of health, do you have any difficulty performing the following activities: 12/21/2016  Hearing? N  Vision? N  Difficulty concentrating or making decisions? N  Walking or climbing stairs? Y  Dressing or bathing? Y  Doing errands, shopping? Y  Preparing Food and eating ? Y  Using the Toilet? Y  In the past six months, have you accidently leaked urine? Y  Do you have problems with loss of bowel control? N  Managing your Medications? Y  Managing your Finances? Y  Housekeeping or managing your Housekeeping? Y    Immunizations and Health Maintenance  There is no immunization  history on file for this patient. Health Maintenance Due  Topic Date Due  . FOOT EXAM  01/12/1942  . OPHTHALMOLOGY EXAM  01/12/1942    No care team member to display  Indicate any recent Medical Services you may have received from other than [facility] providers in the past year (date may be approximate).     Assessment:   This is a routine wellness examination for Brandy Martinez.   Hearing/Vision screen No exam data present  Dietary issues and exercise activities discussed: Current Exercise Habits: The patient does not participate in regular exercise at present, Exercise limited by: orthopedic condition(s)  Goals    . Maintain Lifestyle          Starting today pt will maintain lifestyle.       Depression Screen PHQ 2/9 Scores 12/21/2016  PHQ - 2 Score 0    Fall Risk Fall Risk  12/21/2016  Falls in the past year? No    Cognitive Function:     6CIT Screen 12/21/2016  What Year? 0 points  What month? 3 points  What time? 0 points  Count back from 20 0 points  Months in reverse 2 points  Repeat phrase 0 points  Total Score 5    Screening Tests Health Maintenance  Topic Date Due  . FOOT EXAM  01/12/1942  . OPHTHALMOLOGY EXAM  01/12/1942  . INFLUENZA VACCINE  08/02/2017 (Originally 03/02/2017)  . DEXA SCAN  08/02/2017 (Originally 01/12/1999)  . TETANUS/TDAP  08/02/2017 (Originally 01/11/1951)  . PNA vac Low Risk Adult (1 of 2 - PCV13) 08/02/2017 (Originally 01/12/1999)  . HEMOGLOBIN A1C  04/06/2017      Plan:    I have personally reviewed and addressed the Medicare Annual Wellness questionnaire and have noted the following in the patient's chart:  A. Medical and social history B. Use of alcohol, tobacco or illicit drugs  C. Current medications and supplements D. Functional ability and status E.  Nutritional status F.  Physical activity G. Advance directives H. List of other physicians I.  Hospitalizations, surgeries, and ER visits in previous 12 months J.   Vitals K. Screenings to include hearing, vision, cognitive, depression L. Referrals and appointments - none  In addition, I have reviewed and discussed with patient certain preventive protocols, quality metrics, and best practice recommendations. A written personalized care plan for preventive services as well as general preventive health recommendations were provided to patient.  See attached scanned questionnaire for additional information.   Signed,   Annetta Maw, RN Nurse Health Advisor

## 2016-12-21 NOTE — Progress Notes (Signed)
Quick Notes   Health Maintenance: DEXA due, let me know if you want me to order this. PNA13 and flu ordered. Foot exam and eye exam due. Pt said shes been asking for awhile for a eye exam with her doctor- Tepedino @ [facility]     Abnormal Screen: 6 CIT-5     Patient Concerns: She has been having stomach issues and would like to be seen. A lot of bloating with and without eating.     Nurse Concerns: None

## 2016-12-21 NOTE — Patient Instructions (Signed)
Ms. Brandy Martinez , Thank you for taking time to come for your Medicare Wellness Visit. I appreciate your ongoing commitment to your health goals. Please review the following plan we discussed and let me know if I can assist you in the future.   Screening recommendations/referrals: Colonoscopy- pt over age 81 Mammogram-pt over age 81 Bone Density due Recommended yearly ophthalmology/optometry visit for glaucoma screening and checkup Recommended yearly dental visit for hygiene and checkup  Vaccinations: Influenza vaccine due, ordered Pneumococcal vaccine due, ordered Tdap vaccine not in recoreds Shingles vaccine not in records  Advanced directives: Need copy for chart  Conditions/risks identified: none   Next appointment: none upcoming   Preventive Care 65 Years and Older, Female Preventive care refers to lifestyle choices and visits with your health care provider that can promote health and wellness. What does preventive care include?  A yearly physical exam. This is also called an annual well check.  Dental exams once or twice a year.  Routine eye exams. Ask your health care provider how often you should have your eyes checked.  Personal lifestyle choices, including:  Daily care of your teeth and gums.  Regular physical activity.  Eating a healthy diet.  Avoiding tobacco and drug use.  Limiting alcohol use.  Practicing safe sex.  Taking low-dose aspirin every day.  Taking vitamin and mineral supplements as recommended by your health care provider. What happens during an annual well check? The services and screenings done by your health care provider during your annual well check will depend on your age, overall health, lifestyle risk factors, and family history of disease. Counseling  Your health care provider may ask you questions about your:  Alcohol use.  Tobacco use.  Drug use.  Emotional well-being.  Home and relationship well-being.  Sexual  activity.  Eating habits.  History of falls.  Memory and ability to understand (cognition).  Work and work Astronomerenvironment.  Reproductive health. Screening  You may have the following tests or measurements:  Height, weight, and BMI.  Blood pressure.  Lipid and cholesterol levels. These may be checked every 5 years, or more frequently if you are over 81 years old.  Skin check.  Lung cancer screening. You may have this screening every year starting at age 81 if you have a 30-pack-year history of smoking and currently smoke or have quit within the past 15 years.  Fecal occult blood test (FOBT) of the stool. You may have this test every year starting at age 81.  Flexible sigmoidoscopy or colonoscopy. You may have a sigmoidoscopy every 5 years or a colonoscopy every 10 years starting at age 81.  Hepatitis C blood test.  Hepatitis B blood test.  Sexually transmitted disease (STD) testing.  Diabetes screening. This is done by checking your blood sugar (glucose) after you have not eaten for a while (fasting). You may have this done every 1-3 years.  Bone density scan. This is done to screen for osteoporosis. You may have this done starting at age 81.  Mammogram. This may be done every 1-2 years. Talk to your health care provider about how often you should have regular mammograms. Talk with your health care provider about your test results, treatment options, and if necessary, the need for more tests. Vaccines  Your health care provider may recommend certain vaccines, such as:  Influenza vaccine. This is recommended every year.  Tetanus, diphtheria, and acellular pertussis (Tdap, Td) vaccine. You may need a Td booster every 10 years.  Zoster vaccine.  You may need this after age 51.  Pneumococcal 13-valent conjugate (PCV13) vaccine. One dose is recommended after age 2.  Pneumococcal polysaccharide (PPSV23) vaccine. One dose is recommended after age 84. Talk to your health care  provider about which screenings and vaccines you need and how often you need them. This information is not intended to replace advice given to you by your health care provider. Make sure you discuss any questions you have with your health care provider. Document Released: 08/15/2015 Document Revised: 04/07/2016 Document Reviewed: 05/20/2015 Elsevier Interactive Patient Education  2017 [place] Prevention in the Home Falls can cause injuries. They can happen to people of all ages. There are many things you can do to make your home safe and to help prevent falls. What can I do on the outside of my home?  Regularly fix the edges of walkways and driveways and fix any cracks.  Remove anything that might make you trip as you walk through a door, such as a raised step or threshold.  Trim any bushes or trees on the path to your home.  Use bright outdoor lighting.  Clear any walking paths of anything that might make someone trip, such as rocks or tools.  Regularly check to see if handrails are loose or broken. Make sure that both sides of any steps have handrails.  Any raised decks and porches should have guardrails on the edges.  Have any leaves, snow, or ice cleared regularly.  Use sand or salt on walking paths during winter.  Clean up any spills in your garage right away. This includes oil or grease spills. What can I do in the bathroom?  Use night lights.  Install grab bars by the toilet and in the tub and shower. Do not use towel bars as grab bars.  Use non-skid mats or decals in the tub or shower.  If you need to sit down in the shower, use a plastic, non-slip stool.  Keep the floor dry. Clean up any water that spills on the floor as soon as it happens.  Remove soap buildup in the tub or shower regularly.  Attach bath mats securely with double-sided non-slip rug tape.  Do not have throw rugs and other things on the floor that can make you trip. What can I do in  the bedroom?  Use night lights.  Make sure that you have a light by your bed that is easy to reach.  Do not use any sheets or blankets that are too big for your bed. They should not hang down onto the floor.  Have a firm chair that has side arms. You can use this for support while you get dressed.  Do not have throw rugs and other things on the floor that can make you trip. What can I do in the kitchen?  Clean up any spills right away.  Avoid walking on wet floors.  Keep items that you use a lot in easy-to-reach places.  If you need to reach something above you, use a strong step stool that has a grab bar.  Keep electrical cords out of the way.  Do not use floor polish or wax that makes floors slippery. If you must use wax, use non-skid floor wax.  Do not have throw rugs and other things on the floor that can make you trip. What can I do with my stairs?  Do not leave any items on the stairs.  Make sure that there are handrails on  both sides of the stairs and use them. Fix handrails that are broken or loose. Make sure that handrails are as long as the stairways.  Check any carpeting to make sure that it is firmly attached to the stairs. Fix any carpet that is loose or worn.  Avoid having throw rugs at the top or bottom of the stairs. If you do have throw rugs, attach them to the floor with carpet tape.  Make sure that you have a light switch at the top of the stairs and the bottom of the stairs. If you do not have them, ask someone to add them for you. What else can I do to help prevent falls?  Wear shoes that:  Do not have high heels.  Have rubber bottoms.  Are comfortable and fit you well.  Are closed at the toe. Do not wear sandals.  If you use a stepladder:  Make sure that it is fully opened. Do not climb a closed stepladder.  Make sure that both sides of the stepladder are locked into place.  Ask someone to hold it for you, if possible.  Clearly mark and  make sure that you can see:  Any grab bars or handrails.  First and last steps.  Where the edge of each step is.  Use tools that help you move around (mobility aids) if they are needed. These include:  Canes.  Walkers.  Scooters.  Crutches.  Turn on the lights when you go into a dark area. Replace any light bulbs as soon as they burn out.  Set up your furniture so you have a clear path. Avoid moving your furniture around.  If any of your floors are uneven, fix them.  If there are any pets around you, be aware of where they are.  Review your medicines with your doctor. Some medicines can make you feel dizzy. This can increase your chance of falling. Ask your doctor what other things that you can do to help prevent falls. This information is not intended to replace advice given to you by your health care provider. Make sure you discuss any questions you have with your health care provider. Document Released: 05/15/2009 Document Revised: 12/25/2015 Document Reviewed: 08/23/2014 Elsevier Interactive Patient Education  2017 [employer].

## 2017-03-28 ENCOUNTER — Encounter (HOSPITAL_COMMUNITY): Payer: Self-pay | Admitting: Emergency Medicine

## 2017-03-28 ENCOUNTER — Emergency Department (HOSPITAL_COMMUNITY)
Admission: EM | Admit: 2017-03-28 | Discharge: 2017-03-28 | Disposition: A | Discharge status: Home / Self Care | Payer: Medicare Other | Attending: Emergency Medicine | Admitting: Emergency Medicine

## 2017-03-28 ENCOUNTER — Emergency Department (HOSPITAL_COMMUNITY): Payer: Medicare Other

## 2017-03-28 DIAGNOSIS — Y999 Unspecified external cause status: Secondary | ICD-10-CM | POA: Insufficient documentation

## 2017-03-28 DIAGNOSIS — W0110XA Fall on same level from slipping, tripping and stumbling with subsequent striking against unspecified object, initial encounter: Secondary | ICD-10-CM | POA: Diagnosis not present

## 2017-03-28 DIAGNOSIS — S6992XA Unspecified injury of left wrist, hand and finger(s), initial encounter: Secondary | ICD-10-CM | POA: Diagnosis present

## 2017-03-28 DIAGNOSIS — E119 Type 2 diabetes mellitus without complications: Secondary | ICD-10-CM | POA: Insufficient documentation

## 2017-03-28 DIAGNOSIS — Y9301 Activity, walking, marching and hiking: Secondary | ICD-10-CM | POA: Insufficient documentation

## 2017-03-28 DIAGNOSIS — Z79899 Other long term (current) drug therapy: Secondary | ICD-10-CM | POA: Insufficient documentation

## 2017-03-28 DIAGNOSIS — S42032A Displaced fracture of lateral end of left clavicle, initial encounter for closed fracture: Secondary | ICD-10-CM | POA: Diagnosis not present

## 2017-03-28 DIAGNOSIS — Z7984 Long term (current) use of oral hypoglycemic drugs: Secondary | ICD-10-CM | POA: Diagnosis not present

## 2017-03-28 DIAGNOSIS — Y92002 Bathroom of unspecified non-institutional (private) residence single-family (private) house as the place of occurrence of the external cause: Secondary | ICD-10-CM | POA: Insufficient documentation

## 2017-03-28 DIAGNOSIS — W19XXXA Unspecified fall, initial encounter: Secondary | ICD-10-CM

## 2017-03-28 DIAGNOSIS — Z7982 Long term (current) use of aspirin: Secondary | ICD-10-CM | POA: Insufficient documentation

## 2017-03-28 DIAGNOSIS — G20 Parkinson's disease: Secondary | ICD-10-CM | POA: Insufficient documentation

## 2017-03-28 DIAGNOSIS — S62102A Fracture of unspecified carpal bone, left wrist, initial encounter for closed fracture: Secondary | ICD-10-CM | POA: Diagnosis not present

## 2017-03-28 LAB — BASIC METABOLIC PANEL
ANION GAP: 9 (ref 5–15)
BUN: 28 mg/dL — ABNORMAL HIGH (ref 6–20)
CO2: 22 mmol/L (ref 22–32)
Calcium: 9 mg/dL (ref 8.9–10.3)
Chloride: 102 mmol/L (ref 101–111)
Creatinine, Ser: 0.91 mg/dL (ref 0.44–1.00)
GFR calc Af Amer: >60 mL/min (ref >60)
GFR, EST NON AFRICAN AMERICAN: 57 mL/min — AB (ref >60)
GLUCOSE: 112 mg/dL — AB (ref 65–99)
POTASSIUM: 4.7 mmol/L (ref 3.5–5.1)
Sodium: 133 mmol/L — ABNORMAL LOW (ref 135–145)

## 2017-03-28 LAB — CBC WITH DIFFERENTIAL/PLATELET
Basophils Absolute: 0 10*3/uL (ref 0.0–0.1)
Basophils Relative: 0 %
Eosinophils Absolute: 0.1 10*3/uL (ref 0.0–0.7)
Eosinophils Relative: 1 %
HCT: 32.6 % — ABNORMAL LOW (ref 36.0–46.0)
Hemoglobin: 11.5 g/dL — ABNORMAL LOW (ref 12.0–15.0)
Lymphocytes Relative: 20 %
Lymphs Abs: 2.4 10*3/uL (ref 0.7–4.0)
MCH: 32.9 pg (ref 26.0–34.0)
MCHC: 35.3 g/dL (ref 30.0–36.0)
MCV: 93.1 fL (ref 78.0–100.0)
Monocytes Absolute: 0.8 10*3/uL (ref 0.1–1.0)
Monocytes Relative: 7 %
Neutro Abs: 8.9 10*3/uL — ABNORMAL HIGH (ref 1.7–7.7)
Neutrophils Relative %: 72 %
Platelets: 209 10*3/uL (ref 150–400)
RBC: 3.5 MIL/uL — ABNORMAL LOW (ref 3.87–5.11)
RDW: 11.7 % (ref 11.5–15.5)
WBC: 12.2 10*3/uL — ABNORMAL HIGH (ref 4.0–10.5)

## 2017-03-28 MED ORDER — TRAMADOL HCL 50 MG PO TABS: 50.0000 mg | ORAL_TABLET | Freq: Four times a day (QID) | ORAL | 0 refills | Status: DC | PRN

## 2017-03-28 NOTE — ED Notes (Signed)
Pt's IV has been D/C @this  time

## 2017-03-28 NOTE — ED Notes (Signed)
Bed: [facility]ALA Expected date:  Expected time:  Means of arrival:  Comments: 

## 2017-03-28 NOTE — ED Notes (Signed)
Bed: WA08 Expected date:  Expected time:  Means of arrival:  Comments: EMS-wrist fracture

## 2017-03-28 NOTE — ED Notes (Signed)
Patient requesting her nephew be called for update. Nephew called with no answer.

## 2017-03-28 NOTE — Discharge Instructions (Signed)
Continue taking tylenol as needed for your as prescribed by your doctor.   Take tramadol 1 tablet every 6 hrs for severe pain.   See Dr. Eulah Pont in the office later this week. Call for appointment,   Keep splint and sling on.   Return to ER if you have worse shoulder pain, wrist pain, numbness or tingling of your fingers, fingers turning blue

## 2017-03-28 NOTE — ED Notes (Signed)
Ortho called to apply splint. 

## 2017-03-28 NOTE — ED Notes (Signed)
ED Provider at bedside. 

## 2017-03-28 NOTE — ED Triage Notes (Signed)
Per EMS, patient from [facility], patient reports unwitnessed fall last night in the bathroom. Per staff, XR results show distal radial fracture to left wrist. A&Ox4.  BP 132/68 HR 96 RR 16 O2 96% RA CBG 134

## 2017-03-28 NOTE — ED Notes (Signed)
Patient transported to CT 

## 2017-03-28 NOTE — ED Provider Notes (Signed)
[facility]-EMERGENCY DEPT Provider Note   CSN: [id number] Arrival date & time: 03/28/17  1316     History   Chief Complaint Chief Complaint  Patient presents with  . Wrist Pain    HPI Brandy Martinez is a 83 y.o. female history diabetes, hyperlipidemia, hypertension, Parkinson's, osteoporosis here presenting with fall with left wrist fracture. Patient is currently at [facility] rehabilitation facility. Patient states that she tripped over something in the bathroom and fell with outstretched hands on the left wrist yesterday. She also hit her head as well. She denies passing out or any chest pain. She had x-ray at the facility that showed a distal radius fracture and sent here for evaluation. Patient states that she took some Tylenol and does not want any pain medicine currently.   The history is provided by the patient.    Past Medical History:  Diagnosis Date  . Anemia   . B12 deficiency   . Delirium   . Diabetes mellitus without complication (HCC)   . Elevated TSH 06/18/2016  . Generalized weakness   . Hyperlipidemia   . Hypertension   . Idiopathic peripheral neuropathy   . Inability to walk   . Mobility impaired   . Neurologic gait dysfunction   . Parkinson's disease [facility])     Patient Active Problem List   Diagnosis Date Noted  . Senile osteoporosis 11/03/2016  . Type 2 diabetes mellitus without complication, without long-term current use of insulin (HCC) 11/03/2016  . Cognitive impairment 11/03/2016  . Parkinson's disease (HCC) 04/29/2016  . Osteoporosis 04/29/2016  . B12 deficiency 04/29/2016  . Major depression, chronic 04/29/2016  . Hyperlipidemia LDL goal <70 04/29/2016  . Essential hypertension, benign 04/29/2016  . Acquired hypothyroidism 04/29/2016  . Diabetes mellitus type 2 in nonobese (HCC) 04/29/2016  . RLS (restless legs syndrome) 04/29/2016    Past Surgical History:  Procedure Laterality Date  . APPENDECTOMY      OB History    No data available        Home Medications    Prior to Admission medications   Medication Sig Start Date End Date Taking? Authorizing Provider  acetaminophen (TYLENOL) 325 MG tablet Take 650 mg by mouth every 4 (four) hours as needed for mild pain or moderate pain.   Yes [provider]  alendronate (FOSAMAX) 70 MG tablet Take 70 mg by mouth every Thursday. Take with a full glass of water on an empty stomach.  Take on Thursdays.   Yes [provider]  ascorbic acid (VITAMIN C) 500 MG tablet Take 500 mg by mouth daily.   Yes [provider]  aspirin EC 81 MG tablet Take 81 mg by mouth daily.   Yes [provider]  b complex vitamins capsule Take 1 capsule by mouth at bedtime.    Yes [provider]  carbidopa-levodopa (SINEMET IR) 25-100 MG tablet Take 1 tablet by mouth 5 (five) times daily.    Yes [provider]  carboxymethylcellulose (REFRESH PLUS) 0.5 % SOLN Place 1 drop into both eyes as needed.   Yes [provider]  cyanocobalamin (,VITAMIN B-12,) 1000 MCG/ML injection Inject 1,000 mcg into the muscle every 30 (thirty) days. Give 1 mL into the muscle   Yes [provider]  DULoxetine (CYMBALTA) 60 MG capsule Take 60 mg by mouth daily.   Yes [provider]  levothyroxine (SYNTHROID, LEVOTHROID) 75 MCG tablet Take 75 mcg by mouth daily before breakfast.   Yes [provider]  lisinopril (  PRINIVIL,ZESTRIL) 5 MG tablet Take 5 mg by mouth daily. Hold for SBP <110   Yes [provider]  Melatonin 3 MG TABS Take 1 tablet by mouth at bedtime.   Yes [provider]  metFORMIN (GLUCOPHAGE) 500 MG tablet Take 500 mg by mouth 2 (two) times daily with a meal.    Yes [provider]  mirtazapine (REMERON) 7.5 MG tablet Take 7.5 mg by mouth at bedtime.   Yes [provider]  rOPINIRole (REQUIP) 0.5 MG tablet Take 0.5 mg by mouth 3 (three) times daily.    Yes [provider]  rosuvastatin  (CRESTOR) 10 MG tablet Take 10 mg by mouth daily.   Yes [provider]  thiamine 100 MG tablet Take 100 mg by mouth daily.   Yes [provider]  calcium-vitamin D (OSCAL WITH D) 500-200 MG-UNIT tablet Take 1 tablet by mouth daily.    [provider]  traMADol (ULTRAM) 50 MG tablet Take 50 mg by mouth every 6 (six) hours as needed for moderate pain.    [provider]    Family History Family History  Problem Relation Age of Onset  . Hypertension Mother   . Hypertension Father   . Heart disease Father   . Heart disease Sister   . Diabetes Sister   . Cancer Sister   . Cancer Brother     Social History Social History  Substance Use Topics  . Smoking status: Never Smoker  . Smokeless tobacco: Never Used  . Alcohol use No     Allergies   Codeine   Review of Systems Review of Systems  Musculoskeletal:       L wrist pain   All other systems reviewed and are negative.    Physical Exam Updated Vital Signs BP (!) 128/55   Pulse 94   Temp 98.1 F (36.7 C) (Oral)   Resp 16   SpO2 100%   Physical Exam  Constitutional: She is oriented to person, place, and time. She appears well-developed.  HENT:  Head: Normocephalic.  Mouth/Throat: Oropharynx is clear and moist.  No obvious scalp hematoma   Eyes: Pupils are equal, round, and reactive to light. Conjunctivae and EOM are normal.  Neck: Normal range of motion. Neck supple.  Cardiovascular: Normal rate, regular rhythm and normal heart sounds.   Pulmonary/Chest: Effort normal and breath sounds normal. No respiratory distress. She has no wheezes. She has no rales.  Abdominal: Soft. Bowel sounds are normal. She exhibits no distension. There is no tenderness. There is no guarding.  Musculoskeletal: Normal range of motion.  Obvious L wrist deformity, 2+ radial pulses, able to wiggle fingers. Skin tear L elbow, no obvious deformity. Abrasion L shoulder, able to range it. Pelvis stable, nl ROM  bilateral hips. No midline spinal tenderness   Neurological: She is alert and oriented to person, place, and time.  Nursing note and vitals reviewed.    ED Treatments / Results  Labs (all labs ordered are listed, but only abnormal results are displayed) Labs Reviewed  CBC WITH DIFFERENTIAL/PLATELET - Abnormal; Notable for the following:       Result Value   WBC 12.2 (*)    RBC 3.50 (*)    Hemoglobin 11.5 (*)    HCT 32.6 (*)    Neutro Abs 8.9 (*)    All other components within normal limits  BASIC METABOLIC PANEL - Abnormal; Notable for the following:    Sodium 133 (*)    Glucose,  Bld 112 (*)    BUN 28 (*)    GFR calc non Af Amer 57 (*)    All other components within normal limits    EKG  EKG Interpretation None       Radiology Dg Chest 2 View  Result Date: 03/28/2017 CLINICAL DATA:  Fall in bathroom yesterday. Left upper extremity pain. EXAM: CHEST  2 VIEW COMPARISON:  Left shoulder radiographs of the same day. FINDINGS: The heart size is normal. Aortic atherosclerosis is present. There is no edema or effusion. The lungs are clear. The visualized soft tissues and bony thorax are unremarkable. Comminuted distal left clavicle fracture is noted. IMPRESSION: 1. Comminuted distal left clavicle fracture. 2. No acute cardiopulmonary disease. 3. Aortic atherosclerosis. Electronically Signed   By: Marin Roberts M.D.   On: 03/28/2017 15:45   Dg Elbow Complete Left  Result Date: 03/28/2017 CLINICAL DATA:  Acute left elbow pain following fall. Initial encounter. EXAM: LEFT ELBOW - COMPLETE 3+ VIEW COMPARISON:  None. FINDINGS: There is no evidence of acute fracture, subluxation or dislocation. No joint effusion is noted. No focal bony lesions are identified. IMPRESSION: Negative. Electronically Signed   By: Harmon Pier M.D.   On: 03/28/2017 15:46   Dg Wrist Complete Left  Result Date: 03/28/2017 CLINICAL DATA:  Acute left wrist pain following fall yesterday. Initial encounter.  EXAM: LEFT WRIST - COMPLETE 3+ VIEW COMPARISON:  None. FINDINGS: A comminuted impacted intra-articular distal radial fracture is noted with apex volar angulation. An ulnar styloid fracture is present. There is no evidence of dislocation. No focal bony lesions are present. IMPRESSION: Comminuted impacted and angulated intraarticular distal radial fracture. Ulnar styloid fracture. Electronically Signed   By: Harmon Pier M.D.   On: 03/28/2017 15:48   Ct Head Wo Contrast  Result Date: 03/28/2017 CLINICAL DATA:  Unwitnessed fall last night, fractured wrist and clavicle EXAM: CT HEAD WITHOUT CONTRAST CT CERVICAL SPINE WITHOUT CONTRAST TECHNIQUE: Multidetector CT imaging of the head and cervical spine was performed following the standard protocol without intravenous contrast. Multiplanar CT image reconstructions of the cervical spine were also generated. COMPARISON:  None. FINDINGS: CT HEAD FINDINGS Brain: Mild generalized age related parenchymal atrophy with commensurate dilatation of the ventricles and sulci. Mild chronic small vessel ischemic changes in the deep periventricular white matter regions bilaterally. There is no mass, hemorrhage, edema or other evidence of acute parenchymal abnormality. No extra-axial hemorrhage. Vascular: There are chronic calcified atherosclerotic changes of the large vessels at the skull base. No unexpected hyperdense vessel. Skull: Normal. Negative for fracture or focal lesion. Sinuses/Orbits: No acute finding. Other: None. CT CERVICAL SPINE FINDINGS Alignment: Slight reversal of the normal cervical spine lordosis, likely related to degenerative change in the mid and lower cervical spine. No evidence of acute vertebral body subluxation. Skull base and vertebrae: No fracture line or displaced fracture fragment identified. Facet joints appear normally aligned throughout. Soft tissues and spinal canal: No prevertebral fluid or swelling. No visible canal hematoma. Disc levels: Disc  desiccations at each level of the cervical spine, most pronounced at the C5-6 through C7-T1 levels with associated disc space narrowings and osseous spurring. Associated disc-osteophytic bulge at the C5-6 level causing a moderate central canal stenosis. Additional milder central canal stenoses at the remaining levels. Upper chest: Negative. Other: Bilateral carotid atherosclerosis. IMPRESSION: 1. No acute intracranial abnormality. No intracranial mass, hemorrhage or edema. No skull fracture. Atrophy and chronic small vessel ischemic changes in the white matter. 2. No fracture or acute subluxation  identified within the cervical spine. Degenerative changes of the cervical spine, mild to moderate in degree, as detailed above. 3. Carotid atherosclerosis. Electronically Signed   By: Bary Richard M.D.   On: 03/28/2017 15:51   Ct Cervical Spine Wo Contrast  Result Date: 03/28/2017 CLINICAL DATA:  Unwitnessed fall last night, fractured wrist and clavicle EXAM: CT HEAD WITHOUT CONTRAST CT CERVICAL SPINE WITHOUT CONTRAST TECHNIQUE: Multidetector CT imaging of the head and cervical spine was performed following the standard protocol without intravenous contrast. Multiplanar CT image reconstructions of the cervical spine were also generated. COMPARISON:  None. FINDINGS: CT HEAD FINDINGS Brain: Mild generalized age related parenchymal atrophy with commensurate dilatation of the ventricles and sulci. Mild chronic small vessel ischemic changes in the deep periventricular white matter regions bilaterally. There is no mass, hemorrhage, edema or other evidence of acute parenchymal abnormality. No extra-axial hemorrhage. Vascular: There are chronic calcified atherosclerotic changes of the large vessels at the skull base. No unexpected hyperdense vessel. Skull: Normal. Negative for fracture or focal lesion. Sinuses/Orbits: No acute finding. Other: None. CT CERVICAL SPINE FINDINGS Alignment: Slight reversal of the normal cervical  spine lordosis, likely related to degenerative change in the mid and lower cervical spine. No evidence of acute vertebral body subluxation. Skull base and vertebrae: No fracture line or displaced fracture fragment identified. Facet joints appear normally aligned throughout. Soft tissues and spinal canal: No prevertebral fluid or swelling. No visible canal hematoma. Disc levels: Disc desiccations at each level of the cervical spine, most pronounced at the C5-6 through C7-T1 levels with associated disc space narrowings and osseous spurring. Associated disc-osteophytic bulge at the C5-6 level causing a moderate central canal stenosis. Additional milder central canal stenoses at the remaining levels. Upper chest: Negative. Other: Bilateral carotid atherosclerosis. IMPRESSION: 1. No acute intracranial abnormality. No intracranial mass, hemorrhage or edema. No skull fracture. Atrophy and chronic small vessel ischemic changes in the white matter. 2. No fracture or acute subluxation identified within the cervical spine. Degenerative changes of the cervical spine, mild to moderate in degree, as detailed above. 3. Carotid atherosclerosis. Electronically Signed   By: Bary Richard M.D.   On: 03/28/2017 15:51   Dg Shoulder Left  Result Date: 03/28/2017 CLINICAL DATA:  Acute left shoulder pain following fall. Initial encounter. EXAM: LEFT SHOULDER - 2+ VIEW COMPARISON:  None. FINDINGS: A comminuted fracture of the distal clavicle is noted with apparent extension to the superior AC joint. No evidence of subluxation or dislocation. No other significant abnormalities noted. IMPRESSION: Comminuted fracture of the distal left clavicle. Electronically Signed   By: Harmon Pier M.D.   On: 03/28/2017 15:46    Procedures Procedures (including critical care time)  Medications Ordered in ED Medications - No data to display   Initial Impression / Assessment and Plan / ED Course  I have reviewed the triage vital signs and the  nursing notes.  Pertinent labs & imaging results that were available during my care of the patient were reviewed by me and considered in my medical decision making (see chart for details).    Brandy Martinez is a 83 y.o. female here with L wrist pain after fall. Patient had mechanical fall yesterday. Had L distal radius fracture on xray yesterday. Also had head injury. Will get CT head/neck, xrays.   3:54 PM Xrays showed distal radius fracture, also L clavicle fracture. Neurovascular intact. I called Dr. Eulah Pont, who recommend sugar tongue splint, sling, outpatient follow up. Patient refuses pain meds, states that tylenol  is sufficient for pain. Will give tramadol prn pain. CT head/neck unremarkable.    Final Clinical Impressions(s) / ED Diagnoses   Final diagnoses:  Fall    New Prescriptions New Prescriptions   No medications on file     Charlynne Pander, MD 03/28/17 1558

## 2017-03-28 NOTE — ED Notes (Signed)
[facility] called for transport.  

## 2017-03-28 NOTE — ED Notes (Signed)
Attempted to give report to [facility] with no answer.

## 2017-05-10 ENCOUNTER — Encounter (HOSPITAL_COMMUNITY): Payer: Self-pay | Admitting: Emergency Medicine

## 2017-05-10 ENCOUNTER — Emergency Department (HOSPITAL_COMMUNITY)
Admission: EM | Admit: 2017-05-10 | Discharge: 2017-05-11 | Disposition: A | Discharge status: Home / Self Care | Payer: Medicare Other | Attending: Emergency Medicine | Admitting: Emergency Medicine

## 2017-05-10 DIAGNOSIS — S0990XA Unspecified injury of head, initial encounter: Secondary | ICD-10-CM | POA: Insufficient documentation

## 2017-05-10 DIAGNOSIS — R079 Chest pain, unspecified: Secondary | ICD-10-CM | POA: Insufficient documentation

## 2017-05-10 DIAGNOSIS — E119 Type 2 diabetes mellitus without complications: Secondary | ICD-10-CM | POA: Insufficient documentation

## 2017-05-10 DIAGNOSIS — Z79899 Other long term (current) drug therapy: Secondary | ICD-10-CM | POA: Insufficient documentation

## 2017-05-10 DIAGNOSIS — Z7982 Long term (current) use of aspirin: Secondary | ICD-10-CM | POA: Insufficient documentation

## 2017-05-10 DIAGNOSIS — W01198A Fall on same level from slipping, tripping and stumbling with subsequent striking against other object, initial encounter: Secondary | ICD-10-CM | POA: Insufficient documentation

## 2017-05-10 DIAGNOSIS — Y939 Activity, unspecified: Secondary | ICD-10-CM | POA: Insufficient documentation

## 2017-05-10 DIAGNOSIS — M25552 Pain in left hip: Secondary | ICD-10-CM | POA: Diagnosis not present

## 2017-05-10 DIAGNOSIS — Z7984 Long term (current) use of oral hypoglycemic drugs: Secondary | ICD-10-CM | POA: Insufficient documentation

## 2017-05-10 DIAGNOSIS — Y92129 Unspecified place in nursing home as the place of occurrence of the external cause: Secondary | ICD-10-CM | POA: Diagnosis not present

## 2017-05-10 DIAGNOSIS — G20 Parkinson's disease: Secondary | ICD-10-CM | POA: Insufficient documentation

## 2017-05-10 DIAGNOSIS — R110 Nausea: Secondary | ICD-10-CM | POA: Diagnosis not present

## 2017-05-10 DIAGNOSIS — W19XXXA Unspecified fall, initial encounter: Secondary | ICD-10-CM

## 2017-05-10 DIAGNOSIS — I10 Essential (primary) hypertension: Secondary | ICD-10-CM | POA: Diagnosis not present

## 2017-05-10 DIAGNOSIS — Y999 Unspecified external cause status: Secondary | ICD-10-CM | POA: Diagnosis not present

## 2017-05-10 NOTE — ED Triage Notes (Addendum)
Pt BIB EMS from [facility] re[facility]. Fall was witnessed. Patient leaned against a wall and slid herself to the floor. No head injury, no LOC, A&O x4. Facility was not going to send patient, but patient began screaming and stating that she wanted to go to the hospital. Patient continually changing where her pain is and what level it is.

## 2017-05-11 ENCOUNTER — Emergency Department (HOSPITAL_COMMUNITY): Payer: Medicare Other

## 2017-05-11 DIAGNOSIS — S0990XA Unspecified injury of head, initial encounter: Secondary | ICD-10-CM | POA: Diagnosis not present

## 2017-05-11 LAB — BASIC METABOLIC PANEL
ANION GAP: 9 (ref 5–15)
BUN: 18 mg/dL (ref 6–20)
CHLORIDE: 103 mmol/L (ref 101–111)
CO2: 24 mmol/L (ref 22–32)
Calcium: 9.2 mg/dL (ref 8.9–10.3)
Creatinine, Ser: 0.61 mg/dL (ref 0.44–1.00)
Glucose, Bld: 116 mg/dL — ABNORMAL HIGH (ref 65–99)
POTASSIUM: 4 mmol/L (ref 3.5–5.1)
SODIUM: 136 mmol/L (ref 135–145)

## 2017-05-11 LAB — I-STAT TROPONIN, ED
TROPONIN I, POC: 0.02 ng/mL (ref 0.00–0.08)
TROPONIN I, POC: 0.01 ng/mL (ref 0.00–0.08)

## 2017-05-11 LAB — CBC WITH DIFFERENTIAL/PLATELET
BASOS ABS: 0 10*3/uL (ref 0.0–0.1)
BASOS PCT: 0 %
EOS ABS: 0.2 10*3/uL (ref 0.0–0.7)
Eosinophils Relative: 1 %
HCT: 35.7 % — ABNORMAL LOW (ref 36.0–46.0)
HEMOGLOBIN: 12.2 g/dL (ref 12.0–15.0)
LYMPHS ABS: 2.6 10*3/uL (ref 0.7–4.0)
Lymphocytes Relative: 22 %
MCH: 32.4 pg (ref 26.0–34.0)
MCHC: 34.2 g/dL (ref 30.0–36.0)
MCV: 94.9 fL (ref 78.0–100.0)
Monocytes Absolute: 0.6 10*3/uL (ref 0.1–1.0)
Monocytes Relative: 6 %
NEUTROS PCT: 71 %
Neutro Abs: 8.3 10*3/uL — ABNORMAL HIGH (ref 1.7–7.7)
Platelets: 258 10*3/uL (ref 150–400)
RBC: 3.76 MIL/uL — AB (ref 3.87–5.11)
RDW: 12.3 % (ref 11.5–15.5)
WBC: 11.7 10*3/uL — AB (ref 4.0–10.5)

## 2017-05-11 LAB — URINALYSIS, MICROSCOPIC (REFLEX)

## 2017-05-11 LAB — URINALYSIS, ROUTINE W REFLEX MICROSCOPIC
BILIRUBIN URINE: NEGATIVE
Glucose, UA: NEGATIVE mg/dL
Hgb urine dipstick: NEGATIVE
NITRITE: NEGATIVE
PROTEIN: NEGATIVE mg/dL
Specific Gravity, Urine: 1.01 (ref 1.005–1.030)
pH: 6.5 (ref 5.0–8.0)

## 2017-05-11 NOTE — ED Notes (Signed)
Calls x 3 to [facility] with no answer to the phone.

## 2017-05-11 NOTE — ED Notes (Signed)
Bed: WA14 Expected date:  Expected time:  Means of arrival:  Comments: 

## 2017-05-11 NOTE — ED Provider Notes (Signed)
TIME SEEN: 12:06 AM  CHIEF COMPLAINT: fall, chest pain  HPI: Patient is an 83 year old female with history of hypertension, hyperlipidemia, diabetes, Parkinson's disease who lives at [facility] nursing facility who presents to the nursing home after she had a witnessed fall. Patient states that she feels like the floors of the nursing home or slipped because they often wax them. She states that because of the floors billing slicker feet went out from underneath her and she slid down the wall. She does report that she hit the back of her head on the wall and then went forward and hit her head on the floor. She states she heard a "wham". Denies loss of consciousness. Reports that she is on aspirin. No other antiplatelets or anticoagulation.  Denies numbness, tingling or focal weakness. Is complaining of some mild left hip pain. Did recently have surgery for wrist fracture. No injury to this arm.    She had an episode of chest tightness in the left side of her chest that lasted 3 minutes while riding with EMS to the ED. Did have some associated nausea but no shortness of breath, dizziness, diaphoresis. Reports she often has chest pain and this felt similar. States that she would not have come to the emergency department just for this chest pain.  No chest pain currently.  ROS: See HPI Constitutional: no fever  Eyes: no drainage  ENT: no runny nose   Cardiovascular:   chest pain  Resp: no SOB  GI: no vomiting GU: no dysuria Integumentary: no rash  Allergy: no hives  Musculoskeletal: no leg swelling  Neurological: no slurred speech ROS otherwise negative  PAST MEDICAL HISTORY/PAST SURGICAL HISTORY:  Past Medical History:  Diagnosis Date  . Anemia   . B12 deficiency   . Delirium   . Diabetes mellitus without complication (HCC)   . Elevated TSH 06/18/2016  . Generalized weakness   . Hyperlipidemia   . Hypertension   . Idiopathic peripheral neuropathy   . Inability to walk   . Mobility  impaired   . Neurologic gait dysfunction   . Parkinson's disease (HCC)     MEDICATIONS:  Prior to Admission medications   Medication Sig Start Date End Date Taking? Authorizing Provider  acetaminophen (TYLENOL) 325 MG tablet Take 650 mg by mouth every 4 (four) hours as needed for mild pain or moderate pain.    [provider]  alendronate (FOSAMAX) 70 MG tablet Take 70 mg by mouth every Thursday. Take with a full glass of water on an empty stomach.  Take on Thursdays.    [provider]  ascorbic acid (VITAMIN C) 500 MG tablet Take 500 mg by mouth daily.    [provider]  aspirin EC 81 MG tablet Take 81 mg by mouth daily.    [provider]  calcium-vitamin D (OSCAL WITH D) 500-200 MG-UNIT tablet Take 1 tablet by mouth daily.    [provider]  carbidopa-levodopa (SINEMET IR) 25-100 MG tablet Take 1 tablet by mouth 5 (five) times daily.     [provider]  carboxymethylcellulose (REFRESH PLUS) 0.5 % SOLN Place 1 drop into both eyes as needed.    [provider]  cyanocobalamin (,VITAMIN B-12,) 1000 MCG/ML injection Inject 1,000 mcg into the muscle every 30 (thirty) days. Give 1 mL into the muscle    [provider]  DULoxetine (CYMBALTA) 60 MG capsule Take 60 mg by mouth daily.    [provider]  levothyroxine (SYNTHROID, LEVOTHROID)  75 MCG tablet Take 75 mcg by mouth daily before breakfast.    [provider]  lisinopril (PRINIVIL,ZESTRIL) 5 MG tablet Take 5 mg by mouth daily. Hold for SBP <110    [provider]  Melatonin 3 MG TABS Take 1 tablet by mouth at bedtime.    [provider]  metFORMIN (GLUCOPHAGE) 500 MG tablet Take 500 mg by mouth 2 (two) times daily with a meal.     [provider]  mirtazapine (REMERON) 7.5 MG tablet Take 7.5 mg by mouth at bedtime.    [provider]  rOPINIRole (REQUIP) 0.5 MG tablet Take 0.5 mg by mouth 3 (three) times daily.      [provider]  rosuvastatin (CRESTOR) 10 MG tablet Take 10 mg by mouth daily.    [provider]  thiamine 100 MG tablet Take 100 mg by mouth daily.    [provider]  traMADol (ULTRAM) 50 MG tablet Take 1 tablet (50 mg total) by mouth every 6 (six) hours as needed. 03/28/17   Charlynne Pander, MD    ALLERGIES:  Allergies  Allergen Reactions  . Codeine     SOCIAL HISTORY:  Social History  Substance Use Topics  . Smoking status: Never Smoker  . Smokeless tobacco: Never Used  . Alcohol use No    FAMILY HISTORY: Family History  Problem Relation Age of Onset  . Hypertension Mother   . Hypertension Father   . Heart disease Father   . Heart disease Sister   . Diabetes Sister   . Cancer Sister   . Cancer Brother     EXAM: BP (!) 150/76   Pulse 90   Temp 97.8 F (36.6 C) (Oral)   Resp 15   SpO2 100%  CONSTITUTIONAL: Alert and oriented 3 and responds appropriately to questions. Well-appearing; well-nourished; GCS 15; elderly HEAD: Normocephalic; atraumatic EYES: Conjunctivae clear, PERRL, EOMI ENT: normal nose; no rhinorrhea; moist mucous membranes; pharynx without lesions noted; no dental injury; no septal hematoma NECK: Supple, no meningismus, no LAD; no midline spinal tenderness, step-off or deformity; trachea midline CARD: RRR; S1 and S2 appreciated; no murmurs, no clicks, no rubs, no gallops RESP: Normal chest excursion without splinting or tachypnea; breath sounds clear and equal bilaterally; no wheezes, no rhonchi, no rales; no hypoxia or respiratory distress CHEST:  chest wall stable, no crepitus or ecchymosis or deformity, nontender to palpation; no flail chest ABD/GI: Normal bowel sounds; non-distended; soft, non-tender, no rebound, no guarding; no ecchymosis or other lesions noted PELVIS:  stable, mildly tender over the left lateral hip, no leg length discrepancy, no deformity BACK:  The back appears normal and is non-tender to  palpation, there is no CVA tenderness; no midline spinal tenderness, step-off or deformity EXT: Normal ROM in all joints; non-tender to palpation; no edema; normal capillary refill; no cyanosis, no bony tenderness or bony deformity of patient's extremities, no joint effusion, compartments are soft, extremities are warm and well-perfused, no ecchymosis SKIN: Normal color for age and race; warm NEURO: Moves all extremities equally, normal sensation diffusely, cranial nerves II through XII intact, normal speech PSYCH: The patient's mood and manner are appropriate. Grooming and personal hygiene are appropriate.  MEDICAL DECISION MAKING: Pt here with mechanical fall. Will obtain CT head, cervical spine. We'll obtain x-ray of the left hip. Also complains of brief episode of chest pain which she reports she has had previously. Describes this as stable angina. No chest pain currently. Hemodynamically stable. EKG  shows no ischemic abnormality. Plan is to obtain chest x-ray and cardiac labs. Doubt ACS, PE, dissection. Doubt pneumonia.  ED PROGRESS: Pt's imaging shows no acute abnormality. Labs unremarkable other than mild leukocytosis but she has no fevers, cough, vomiting or diarrhea, dysuria. Chest x-ray shows no pneumonia. Urinalysis shows no infection. She has had 2 negative troponins here. Still not having any chest pain. I feel she is safe to be discharged back to her nursing facility. She is comfortable with this plan. We discussed return precautions.   At this time, I do not feel there is any life-threatening condition present. I have reviewed and discussed all results (EKG, imaging, lab, urine as appropriate) and exam findings with patient/family. I have reviewed nursing notes and appropriate previous records.  I feel the patient is safe to be discharged home without further emergent workup and can continue workup as an outpatient as needed. Discussed usual and customary return precautions. Patient/family  verbalize understanding and are comfortable with this plan.  Outpatient follow-up has been provided if needed. All questions have been answered.      EKG Interpretation  Date/Time:  Wednesday May 11 2017 00:25:34 EDT Ventricular Rate:  83 PR Interval:  168 QRS Duration: 82 QT Interval:  382 QTC Calculation: 448 R Axis:   37 Text Interpretation:  Normal sinus rhythm Normal ECG Confirmed by Ward, Baxter Hire [phone]) on 05/11/2017 12:36:27 AM          Ward, Layla Maw, DO 05/11/17 6045

## 2017-05-11 NOTE — ED Notes (Signed)
[facility] called for transport.  

## 2017-05-11 NOTE — ED Notes (Signed)
Patient transported to CT and XR 

## 2017-09-16 ENCOUNTER — Encounter (HOSPITAL_COMMUNITY): Payer: Self-pay

## 2017-09-16 ENCOUNTER — Other Ambulatory Visit: Payer: Self-pay

## 2017-09-16 ENCOUNTER — Emergency Department (HOSPITAL_COMMUNITY): Payer: Medicare Other

## 2017-09-16 ENCOUNTER — Emergency Department (HOSPITAL_COMMUNITY)
Admission: EM | Admit: 2017-09-16 | Discharge: 2017-09-17 | Disposition: A | Discharge status: Skilled Nursing Facility | Payer: Medicare Other | Attending: Emergency Medicine | Admitting: Emergency Medicine

## 2017-09-16 DIAGNOSIS — Z7901 Long term (current) use of anticoagulants: Secondary | ICD-10-CM | POA: Insufficient documentation

## 2017-09-16 DIAGNOSIS — Y939 Activity, unspecified: Secondary | ICD-10-CM | POA: Insufficient documentation

## 2017-09-16 DIAGNOSIS — F0390 Unspecified dementia without behavioral disturbance: Secondary | ICD-10-CM | POA: Insufficient documentation

## 2017-09-16 DIAGNOSIS — W19XXXA Unspecified fall, initial encounter: Secondary | ICD-10-CM | POA: Diagnosis not present

## 2017-09-16 DIAGNOSIS — G20 Parkinson's disease: Secondary | ICD-10-CM | POA: Insufficient documentation

## 2017-09-16 DIAGNOSIS — Y929 Unspecified place or not applicable: Secondary | ICD-10-CM | POA: Insufficient documentation

## 2017-09-16 DIAGNOSIS — Z79899 Other long term (current) drug therapy: Secondary | ICD-10-CM | POA: Insufficient documentation

## 2017-09-16 DIAGNOSIS — I10 Essential (primary) hypertension: Secondary | ICD-10-CM | POA: Insufficient documentation

## 2017-09-16 DIAGNOSIS — Z23 Encounter for immunization: Secondary | ICD-10-CM | POA: Insufficient documentation

## 2017-09-16 DIAGNOSIS — Y999 Unspecified external cause status: Secondary | ICD-10-CM | POA: Insufficient documentation

## 2017-09-16 DIAGNOSIS — S61412A Laceration without foreign body of left hand, initial encounter: Secondary | ICD-10-CM | POA: Diagnosis not present

## 2017-09-16 DIAGNOSIS — S6982XA Other specified injuries of left wrist, hand and finger(s), initial encounter: Secondary | ICD-10-CM | POA: Diagnosis present

## 2017-09-16 DIAGNOSIS — E119 Type 2 diabetes mellitus without complications: Secondary | ICD-10-CM | POA: Diagnosis not present

## 2017-09-16 MED ORDER — TETANUS-DIPHTH-ACELL PERTUSSIS 5-2.5-18.5 LF-MCG/0.5 IM SUSP
0.5000 mL | Freq: Once | INTRAMUSCULAR | Status: AC
  Administered 2017-09-16: 0.5 mL via INTRAMUSCULAR
  Filled 2017-09-16: qty 0.5

## 2017-09-16 MED ORDER — LORAZEPAM 2 MG/ML IJ SOLN
1.0000 mg | Freq: Once | INTRAMUSCULAR | Status: AC
  Administered 2017-09-16: 1 mg via INTRAMUSCULAR
  Filled 2017-09-16: qty 1

## 2017-09-16 NOTE — ED Triage Notes (Signed)
Pt arrives via [facility] after an unwitnessed fall today. Per [facility], facility states that pt will not stay in the bed, has been agitated, etc. Ativan given around 4pm. A/O to baseline, hallucinating with EMS. Skin tear noted to left hand, wet to dry dressing placed by [facility].

## 2017-09-16 NOTE — ED Notes (Signed)
Bed: [facility]ALD Expected date:  Expected time:  Means of arrival:  Comments: 

## 2017-09-16 NOTE — ED Notes (Signed)
Bed: WA20 Expected date:  Expected time:  Means of arrival:  Comments: 84 yo F  Fall, skin tear hand

## 2017-09-16 NOTE — ED Provider Notes (Signed)
TIME SEEN: 11:32 PM  CHIEF COMPLAINT: Unwitnessed fall  HPI: Patient is an 84 year old female with history of dementia who presents from [facility] with an unwitnessed fall.  Has a skin tear to the left hand.  Patient at her neurologic baseline.  ROS: Level 5 caveat for dementia  PAST MEDICAL HISTORY/PAST SURGICAL HISTORY:  Past Medical History:  Diagnosis Date  . Anemia   . B12 deficiency   . Delirium   . Diabetes mellitus without complication (HCC)   . Elevated TSH 06/18/2016  . Generalized weakness   . Hyperlipidemia   . Hypertension   . Idiopathic peripheral neuropathy   . Inability to walk   . Mobility impaired   . Neurologic gait dysfunction   . Parkinson's disease (HCC)     MEDICATIONS:  Prior to Admission medications   Medication Sig Start Date End Date Taking? Authorizing Provider  acetaminophen (TYLENOL) 325 MG tablet Take 650 mg by mouth every 4 (four) hours as needed for mild pain or moderate pain.   Yes [provider]  alendronate (FOSAMAX) 70 MG tablet Take 70 mg by mouth every Thursday. Take with a full glass of water on an empty stomach.  Take on Thursdays.   Yes [provider]  aspirin EC 81 MG tablet Take 81 mg by mouth daily.   Yes [provider]  carbidopa-levodopa (SINEMET IR) 25-100 MG tablet Take 1 tablet by mouth 3 (three) times daily.    Yes [provider]  ciprofloxacin (CIPRO) 500 MG tablet Take 500 mg by mouth 2 (two) times daily.  09/09/17  Yes [provider]  DULoxetine (CYMBALTA) 60 MG capsule Take 60 mg by mouth daily.   Yes [provider]  levothyroxine (SYNTHROID, LEVOTHROID) 75 MCG tablet Take 75 mcg by mouth daily before breakfast.   Yes [provider]  lisinopril (PRINIVIL,ZESTRIL) 2.5 MG tablet Take 2.5 mg by mouth daily.  08/21/17  Yes [provider]  Melatonin 3 MG TABS Take 1 tablet by mouth at bedtime.   Yes [provider]  menthol-cetylpyridinium  (CEPACOL) 3 MG lozenge Take 1 lozenge by mouth every 4 (four) hours as needed for sore throat.   Yes [provider]  metFORMIN (GLUCOPHAGE) 500 MG tablet Take 500 mg by mouth 2 (two) times daily with a meal.    Yes [provider]  rOPINIRole (REQUIP) 0.5 MG tablet Take 0.5 mg by mouth 3 (three) times daily.    Yes [provider]  traMADol (ULTRAM) 50 MG tablet Take 1 tablet (50 mg total) by mouth every 6 (six) hours as needed. 03/28/17  Yes Charlynne PanderYao, David Hsienta, MD  carboxymethylcellulose (REFRESH PLUS) 0.5 % SOLN Place 1 drop into both eyes 2 (two) times daily as needed (dry eyes).     [provider]    ALLERGIES:  Allergies  Allergen Reactions  . Codeine     SOCIAL HISTORY:  Social History   Tobacco Use  . Smoking status: Never Smoker  . Smokeless tobacco: Never Used  Substance Use Topics  . Alcohol use: No    FAMILY HISTORY: Family History  Problem Relation Age of Onset  . Hypertension Mother   . Hypertension Father   . Heart disease Father   . Heart disease Sister   . Diabetes Sister   . Cancer Sister   . Cancer Brother     EXAM: BP (!) 111/97 (BP Location: Left Arm)   Pulse (!) 56   Temp 97.7 F (  36.5 C) (Oral)   Resp 18   SpO2 92%  CONSTITUTIONAL: Alert and is able to talk but does not answer questions or follow commands, very agitated, elderly HEAD: Normocephalic; atraumatic EYES: Conjunctivae clear, PERRL, EOMI ENT: normal nose; no rhinorrhea; moist mucous membranes; pharynx without lesions noted; no dental injury; no septal hematoma NECK: Supple, no meningismus, no LAD; no midline spinal tenderness, step-off or deformity; trachea midline CARD: RRR; S1 and S2 appreciated; no murmurs, no clicks, no rubs, no gallops RESP: Normal chest excursion without splinting or tachypnea; breath sounds clear and equal bilaterally; no wheezes, no rhonchi, no rales; no hypoxia or respiratory distress CHEST:  chest wall stable, no crepitus  or ecchymosis or deformity, nontender to palpation; no flail chest ABD/GI: Normal bowel sounds; non-distended; soft, non-tender, no rebound, no guarding; no ecchymosis or other lesions noted PELVIS:  stable, nontender to palpation BACK:  The back appears normal and is non-tender to palpation, there is no CVA tenderness; no midline spinal tenderness, step-off or deformity EXT: Normal ROM in all joints; non-tender to palpation; no edema; normal capillary refill; no cyanosis, no bony tenderness or bony deformity of patient's extremities, no joint effusion, compartments are soft, extremities are warm and well-perfused, no ecchymosis, small skin tear to the dorsal left hand SKIN: Normal color for age and race; warm NEURO: Moves all extremities equally PSYCH: The patient's mood and manner are appropriate. Grooming and personal hygiene are appropriate.  MEDICAL DECISION MAKING: Patient here with unwitnessed fall.  CT of the head and cervical spine show no acute abnormality.  X-ray of the left hand shows no acute abnormality.  We will clean and dress her left hand skin tear.  Tetanus vaccination is updated.  She is at her neurologic baseline.  I feel she is safe to be discharged back to her nursing facility.  I reviewed all nursing notes, vitals, pertinent previous records, EKGs, lab and urine results, imaging (as available).      Ward, Layla Maw, DO 09/17/17 [phone]

## 2017-09-17 NOTE — ED Notes (Signed)
Wound cleansed with NS. And Tefla gauze applied. Dressing wrapped with kerlex gauze for security purposes.

## 2017-09-17 NOTE — ED Notes (Signed)
Patient transported to X-ray 

## 2017-09-17 NOTE — ED Notes (Signed)
Report called to [facility] at this time. Staff member, Artemio AlySlyvia accepted verbal report. PTAR to be called.

## 2017-09-17 NOTE — Discharge Instructions (Signed)
CT of the head, cervical spine and x-ray of the left hand were normal.

## 2017-11-16 ENCOUNTER — Emergency Department (HOSPITAL_COMMUNITY): Payer: Medicare Other

## 2017-11-16 ENCOUNTER — Emergency Department (HOSPITAL_COMMUNITY)
Admission: EM | Admit: 2017-11-16 | Discharge: 2017-11-16 | Disposition: A | Discharge status: Home / Self Care | Payer: Medicare Other | Attending: Emergency Medicine | Admitting: Emergency Medicine

## 2017-11-16 ENCOUNTER — Encounter (HOSPITAL_COMMUNITY): Payer: Self-pay | Admitting: *Deleted

## 2017-11-16 DIAGNOSIS — Y939 Activity, unspecified: Secondary | ICD-10-CM | POA: Diagnosis not present

## 2017-11-16 DIAGNOSIS — W0110XA Fall on same level from slipping, tripping and stumbling with subsequent striking against unspecified object, initial encounter: Secondary | ICD-10-CM | POA: Diagnosis not present

## 2017-11-16 DIAGNOSIS — Y92129 Unspecified place in nursing home as the place of occurrence of the external cause: Secondary | ICD-10-CM | POA: Diagnosis not present

## 2017-11-16 DIAGNOSIS — Z79899 Other long term (current) drug therapy: Secondary | ICD-10-CM | POA: Diagnosis not present

## 2017-11-16 DIAGNOSIS — Z7982 Long term (current) use of aspirin: Secondary | ICD-10-CM | POA: Diagnosis not present

## 2017-11-16 DIAGNOSIS — G20 Parkinson's disease: Secondary | ICD-10-CM | POA: Insufficient documentation

## 2017-11-16 DIAGNOSIS — M25511 Pain in right shoulder: Secondary | ICD-10-CM | POA: Insufficient documentation

## 2017-11-16 DIAGNOSIS — S0990XA Unspecified injury of head, initial encounter: Secondary | ICD-10-CM | POA: Diagnosis not present

## 2017-11-16 DIAGNOSIS — Y999 Unspecified external cause status: Secondary | ICD-10-CM | POA: Diagnosis not present

## 2017-11-16 DIAGNOSIS — I10 Essential (primary) hypertension: Secondary | ICD-10-CM | POA: Insufficient documentation

## 2017-11-16 DIAGNOSIS — W19XXXA Unspecified fall, initial encounter: Secondary | ICD-10-CM

## 2017-11-16 DIAGNOSIS — E119 Type 2 diabetes mellitus without complications: Secondary | ICD-10-CM | POA: Insufficient documentation

## 2017-11-16 DIAGNOSIS — E039 Hypothyroidism, unspecified: Secondary | ICD-10-CM | POA: Diagnosis not present

## 2017-11-16 NOTE — ED Triage Notes (Signed)
Per EMS, [facility] last night and wanted to be evaluated for head pain/hematoma to posterior left head. Pt also complains of right shoulder pain. Pt has full ROM in right shoulder. Pt denies loss of consciousness, states she slipped on floor and fell. Pt denies any other complaints.

## 2017-11-16 NOTE — ED Notes (Signed)
PTAR called  

## 2017-11-16 NOTE — ED Provider Notes (Signed)
[facility]-EMERGENCY DEPT Provider Note   CSN: [id number] Arrival date & time: 11/16/17  1143     History   Chief Complaint Chief Complaint  Patient presents with  . Fall  . Head Injury  . Shoulder Pain    right    HPI Brandy Martinez is a 84 y.o. female.  84 yo F with a chief complaint of a fall.  This happened yesterday.  The patient thinks that she lost her balance and fell backwards.  Struck the back of her head.  Complaining of posterior headache.  She denies any other injury during the fall though had some pain earlier today when she was trying to raise her right arm above her head.  She feels that pain is resolved spontaneously.  She denies chest pain shortness of breath abdominal pain back pain.  Denies neck pain.  The history is provided by the patient.  Fall  This is a new problem. The current episode started yesterday. The problem occurs rarely. The problem has been resolved. Associated symptoms include headaches. Pertinent negatives include no chest pain, no abdominal pain and no shortness of breath. Nothing aggravates the symptoms. Nothing relieves the symptoms. She has tried nothing for the symptoms. The treatment provided no relief.  Head Injury   Pertinent negatives include no vomiting.  Shoulder Pain      Past Medical History:  Diagnosis Date  . Anemia   . B12 deficiency   . Delirium   . Diabetes mellitus without complication (HCC)   . Elevated TSH 06/18/2016  . Generalized weakness   . Hyperlipidemia   . Hypertension   . Idiopathic peripheral neuropathy   . Inability to walk   . Mobility impaired   . Neurologic gait dysfunction   . Parkinson's disease [facility](HCC)     Patient Active Problem List   Diagnosis Date Noted  . Senile osteoporosis 11/03/2016  . Type 2 diabetes mellitus without complication, without long-term current use of insulin (HCC) 11/03/2016  . Cognitive impairment 11/03/2016  . Parkinson's disease (HCC) 04/29/2016  .  Osteoporosis 04/29/2016  . B12 deficiency 04/29/2016  . Major depression, chronic 04/29/2016  . Hyperlipidemia LDL goal <70 04/29/2016  . Essential hypertension, benign 04/29/2016  . Acquired hypothyroidism 04/29/2016  . Diabetes mellitus type 2 in nonobese (HCC) 04/29/2016  . RLS (restless legs syndrome) 04/29/2016    Past Surgical History:  Procedure Laterality Date  . APPENDECTOMY       OB History   None      Home Medications    Prior to Admission medications   Medication Sig Start Date End Date Taking? Authorizing Provider  alendronate (FOSAMAX) 70 MG tablet Take 70 mg by mouth every Thursday. Take with a full glass of water on an empty stomach.  Take on Thursdays.   Yes [provider]  aspirin EC 81 MG tablet Take 81 mg by mouth daily.   Yes [provider]  carbidopa-levodopa (SINEMET IR) 25-100 MG tablet Take 1 tablet by mouth 3 (three) times daily.    Yes [provider]  DULoxetine (CYMBALTA) 60 MG capsule Take 60 mg by mouth daily.   Yes [provider]  levothyroxine (SYNTHROID, LEVOTHROID) 75 MCG tablet Take 75 mcg by mouth daily before breakfast.   Yes [provider]  lisinopril (PRINIVIL,ZESTRIL) 2.5 MG tablet Take 2.5 mg by mouth daily.  08/21/17  Yes [provider]  Melatonin 3 MG TABS Take 1 tablet by mouth at bedtime.  Yes [provider]  rOPINIRole (REQUIP) 0.5 MG tablet Take 0.5 mg by mouth 3 (three) times daily.    Yes [provider]  acetaminophen (TYLENOL) 325 MG tablet Take 650 mg by mouth every 4 (four) hours as needed for mild pain or moderate pain.    [provider]  traMADol (ULTRAM) 50 MG tablet Take 1 tablet (50 mg total) by mouth every 6 (six) hours as needed. Patient not taking: Reported on 11/16/2017 03/28/17   Charlynne Pander, MD    Family History Family History  Problem Relation Age of Onset  . Hypertension Mother   . Hypertension Father   . Heart  disease Father   . Heart disease Sister   . Diabetes Sister   . Cancer Sister   . Cancer Brother     Social History Social History   Tobacco Use  . Smoking status: Never Smoker  . Smokeless tobacco: Never Used  Substance Use Topics  . Alcohol use: No  . Drug use: No     Allergies   Codeine and Lipitor [atorvastatin calcium]   Review of Systems Review of Systems  Constitutional: Negative for chills and fever.  HENT: Negative for congestion and rhinorrhea.   Eyes: Negative for redness and visual disturbance.  Respiratory: Negative for shortness of breath and wheezing.   Cardiovascular: Negative for chest pain and palpitations.  Gastrointestinal: Negative for abdominal pain, nausea and vomiting.  Genitourinary: Negative for dysuria and urgency.  Musculoskeletal: Negative for arthralgias and myalgias.  Skin: Negative for pallor and wound.  Neurological: Positive for headaches. Negative for dizziness.     Physical Exam Updated Vital Signs BP (!) 123/56   Pulse 76   Temp 97.8 F (36.6 C) (Oral)   Resp 18   SpO2 100%   Physical Exam  Constitutional: She is oriented to person, place, and time. She appears well-developed and well-nourished. No distress.  HENT:  Head: Normocephalic.  Small hematoma to the occiput  Eyes: Pupils are equal, round, and reactive to light. EOM are normal.  Neck: Normal range of motion. Neck supple.  Cardiovascular: Normal rate and regular rhythm. Exam reveals no gallop and no friction rub.  No murmur heard. Pulmonary/Chest: Effort normal. She has no wheezes. She has no rales.  Abdominal: Soft. She exhibits no distension. There is no tenderness.  Musculoskeletal: She exhibits no edema or tenderness.  Full range of motion of the right shoulder without any tenderness.  No palpable bony tenderness.  Palpated from head to toe without any other noted areas of bony tenderness.  Neurological: She is alert and oriented to person, place, and time.    Skin: Skin is warm and dry. She is not diaphoretic.  Psychiatric: She has a normal mood and affect. Her behavior is normal.  Nursing note and vitals reviewed.    ED Treatments / Results  Labs (all labs ordered are listed, but only abnormal results are displayed) Labs Reviewed - No data to display  EKG None  Radiology Dg Shoulder Right  Result Date: 11/16/2017 CLINICAL DATA:  Larey Seat last night, now with right shoulder pain, full range of motion EXAM: RIGHT SHOULDER - 2+ VIEW COMPARISON:  None. FINDINGS: The right humeral head is in normal position and the glenohumeral joint space appears normal for age. The right [facility] joint is normally aligned. No acute fracture is seen. The ribs that are visualized are intact. IMPRESSION: Negative right shoulder. Electronically Signed   By: Dwyane Dee M.D.   On:  11/16/2017 12:44   Ct Head Wo Contrast  Result Date: 11/16/2017 CLINICAL DATA:  Minor head trauma, recent fall EXAM: CT HEAD WITHOUT CONTRAST TECHNIQUE: Contiguous axial images were obtained from the base of the skull through the vertex without intravenous contrast. COMPARISON:  CT brain scan of 09/16/2017 FINDINGS: Brain: There is little change in dilatation of the ventricles and prominent cortical sulci, consistent with diffuse atrophy. The septum is midline in position. The fourth ventricle and basilar cisterns are unremarkable. No hemorrhage, mass lesion, or acute infarction is seen. Vascular: No vascular abnormality is noted on this unenhanced study. Skull: On bone window images, no calvarial abnormality is seen. Sinuses/Orbits: The paranasal sinuses appear well pneumatized. Other: None. IMPRESSION: 1. Stable atrophy. 2. No acute intracranial abnormality. Electronically Signed   By: Dwyane Dee M.D.   On: 11/16/2017 12:27    Procedures Procedures (including critical care time)  Medications Ordered in ED Medications - No data to display   Initial Impression / Assessment and Plan / ED Course   I have reviewed the triage vital signs and the nursing notes.  Pertinent labs & imaging results that were available during my care of the patient were reviewed by me and considered in my medical decision making (see chart for details).     26 yoF with a chief complaint of a fall.  Mechanical by history.  Small hematoma noticed noted on the occiput.  Will obtain a CT of the head.  Patient has full range of motion of the right shoulder without any significant tenderness.  Will obtain plain film.  Imaging unremarkable.  Discharge home.  3:24 PM:  I have discussed the diagnosis/risks/treatment options with the patient and believe the pt to be eligible for discharge home to follow-up with PCP. We also discussed returning to the ED immediately if new or worsening sx occur. We discussed the sx which are most concerning (e.g., sudden worsening pain, fever, inability to tolerate by mouth) that necessitate immediate return. Medications administered to the patient during their visit and any new prescriptions provided to the patient are listed below.  Medications given during this visit Medications - No data to display   Images reviewed right shoulder films viewed by me no fracture   The patient appears reasonably screen and/or stabilized for discharge and I doubt any other medical condition or other [facility] requiring further screening, evaluation, or treatment in the ED at this time prior to discharge.    Final Clinical Impressions(s) / ED Diagnoses   Final diagnoses:  Fall, initial encounter  Acute pain of right shoulder  Minor head injury, initial encounter    ED Discharge Orders    None       Melene Plan, DO 11/16/17 1524

## 2017-11-24 ENCOUNTER — Emergency Department (HOSPITAL_COMMUNITY)
Admission: EM | Admit: 2017-11-24 | Discharge: 2017-11-24 | Disposition: A | Discharge status: Home / Self Care | Payer: Medicare Other | Attending: Emergency Medicine | Admitting: Emergency Medicine

## 2017-11-24 ENCOUNTER — Encounter (HOSPITAL_COMMUNITY): Payer: Self-pay

## 2017-11-24 ENCOUNTER — Emergency Department (HOSPITAL_COMMUNITY): Payer: Medicare Other

## 2017-11-24 DIAGNOSIS — I10 Essential (primary) hypertension: Secondary | ICD-10-CM | POA: Insufficient documentation

## 2017-11-24 DIAGNOSIS — E119 Type 2 diabetes mellitus without complications: Secondary | ICD-10-CM | POA: Insufficient documentation

## 2017-11-24 DIAGNOSIS — Y92129 Unspecified place in nursing home as the place of occurrence of the external cause: Secondary | ICD-10-CM | POA: Insufficient documentation

## 2017-11-24 DIAGNOSIS — Z79899 Other long term (current) drug therapy: Secondary | ICD-10-CM | POA: Insufficient documentation

## 2017-11-24 DIAGNOSIS — G20 Parkinson's disease: Secondary | ICD-10-CM | POA: Diagnosis not present

## 2017-11-24 DIAGNOSIS — S0990XA Unspecified injury of head, initial encounter: Secondary | ICD-10-CM | POA: Insufficient documentation

## 2017-11-24 DIAGNOSIS — S41101A Unspecified open wound of right upper arm, initial encounter: Secondary | ICD-10-CM | POA: Diagnosis not present

## 2017-11-24 DIAGNOSIS — S61412A Laceration without foreign body of left hand, initial encounter: Secondary | ICD-10-CM | POA: Diagnosis not present

## 2017-11-24 DIAGNOSIS — Y939 Activity, unspecified: Secondary | ICD-10-CM | POA: Insufficient documentation

## 2017-11-24 DIAGNOSIS — Z7982 Long term (current) use of aspirin: Secondary | ICD-10-CM | POA: Diagnosis not present

## 2017-11-24 DIAGNOSIS — Y998 Other external cause status: Secondary | ICD-10-CM | POA: Insufficient documentation

## 2017-11-24 DIAGNOSIS — W010XXA Fall on same level from slipping, tripping and stumbling without subsequent striking against object, initial encounter: Secondary | ICD-10-CM | POA: Diagnosis not present

## 2017-11-24 MED ORDER — LIDOCAINE HCL (PF) 1 % IJ SOLN
30.0000 mL | Freq: Once | INTRAMUSCULAR | Status: AC
  Administered 2017-11-24: 30 mL via INTRADERMAL
  Filled 2017-11-24: qty 30

## 2017-11-24 MED ORDER — BACITRACIN-NEOMYCIN-POLYMYXIN 400-5-5000 EX OINT: 1.0000 "application " | TOPICAL_OINTMENT | Freq: Two times a day (BID) | CUTANEOUS | 0 refills | Status: AC

## 2017-11-24 MED ORDER — BACITRACIN ZINC 500 UNIT/GM EX OINT
TOPICAL_OINTMENT | Freq: Once | CUTANEOUS | Status: AC
  Administered 2017-11-24: 1 via TOPICAL
  Filled 2017-11-24: qty 1.8

## 2017-11-24 NOTE — ED Notes (Signed)
ED Provider at bedside. 

## 2017-11-24 NOTE — ED Notes (Signed)
Bed: [facility] Expected date:  Expected time:  Means of arrival:  Comments: EMS 84 [facility]-dementia-no obvious injury

## 2017-11-24 NOTE — ED Triage Notes (Signed)
[facility]  Due to an unwitnessed fall. Pt has 2 skin tears, complained of pain on forehead, denies pain upon arrival. Pt A&O x4, ROM in all extremities. Pt takes 81mg  aspirin daily.

## 2017-11-24 NOTE — ED Provider Notes (Signed)
TIME SEEN: 5:04 AM  CHIEF COMPLAINT: Unwitnessed fall  HPI: Patient is an 84 year old female with history of hypertension, hyperlipidemia, diabetes, neurologic gait dysfunction who presents to the emergency department after a fall at her nursing facility.  States she thinks she lost her balance which is something that is chronic for her.  No preceding symptoms such as chest pain, shortness of breath, dizziness or palpitations.  Denies recent fevers, cough, vomiting or diarrhea.  Has a skin tear to the right elbow, lacerations to the left lateral hand and swelling to the left lateral wrist.  She reports she is right-hand dominant.  Last tetanus vaccination appears to have been September 16, 2017.  Complaining of forehead pain.  Not on blood thinners.  No loss of consciousness.  No neck or back pain.  No chest or abdominal pain.  Denies numbness or weakness.  ROS: See HPI Constitutional: no fever  Eyes: no drainage  ENT: no runny nose   Cardiovascular:  no chest pain  Resp: no SOB  GI: no vomiting GU: no dysuria Integumentary: no rash  Allergy: no hives  Musculoskeletal: no leg swelling  Neurological: no slurred speech ROS otherwise negative  PAST MEDICAL HISTORY/PAST SURGICAL HISTORY:  Past Medical History:  Diagnosis Date  . Anemia   . B12 deficiency   . Delirium   . Diabetes mellitus without complication (HCC)   . Elevated TSH 06/18/2016  . Generalized weakness   . Hyperlipidemia   . Hypertension   . Idiopathic peripheral neuropathy   . Inability to walk   . Mobility impaired   . Neurologic gait dysfunction   . Parkinson's disease (HCC)     MEDICATIONS:  Prior to Admission medications   Medication Sig Start Date End Date Taking? Authorizing Provider  acetaminophen (TYLENOL) 325 MG tablet Take 650 mg by mouth every 4 (four) hours as needed for mild pain or moderate pain.    [provider]  alendronate (FOSAMAX) 70 MG tablet Take 70 mg by mouth every Thursday. Take  with a full glass of water on an empty stomach.  Take on Thursdays.    [provider]  aspirin EC 81 MG tablet Take 81 mg by mouth daily.    [provider]  carbidopa-levodopa (SINEMET IR) 25-100 MG tablet Take 1 tablet by mouth 3 (three) times daily.     [provider]  DULoxetine (CYMBALTA) 60 MG capsule Take 60 mg by mouth daily.    [provider]  levothyroxine (SYNTHROID, LEVOTHROID) 75 MCG tablet Take 75 mcg by mouth daily before breakfast.    [provider]  lisinopril (PRINIVIL,ZESTRIL) 2.5 MG tablet Take 2.5 mg by mouth daily.  08/21/17   [provider]  Melatonin 3 MG TABS Take 1 tablet by mouth at bedtime.    [provider]  rOPINIRole (REQUIP) 0.5 MG tablet Take 0.5 mg by mouth 3 (three) times daily.     [provider]  traMADol (ULTRAM) 50 MG tablet Take 1 tablet (50 mg total) by mouth every 6 (six) hours as needed. Patient not taking: Reported on 11/16/2017 03/28/17   Charlynne Pander, MD    ALLERGIES:  Allergies  Allergen Reactions  . Codeine   . Lipitor [Atorvastatin Calcium]     SOCIAL HISTORY:  Social History   Tobacco Use  . Smoking status: Never Smoker  . Smokeless tobacco: Never Used  Substance Use Topics  . Alcohol use: No    FAMILY HISTORY: Family History  Problem  Relation Age of Onset  . Hypertension Mother   . Hypertension Father   . Heart disease Father   . Heart disease Sister   . Diabetes Sister   . Cancer Sister   . Cancer Brother     EXAM: BP (!) 145/73 (BP Location: Left Arm)   Pulse 65   Temp 98.3 F (36.8 C) (Oral)   Resp 17   Wt 69.1 kg (152 lb 4 oz)   SpO2 100%   BMI 28.77 kg/m  CONSTITUTIONAL: Alert and oriented to person, time and situation but states she is at [facility] and responds appropriately to questions. Well-appearing; well-nourished, elderly, in no distress; GCS 14 HEAD: Normocephalic; atraumatic, no facial tenderness on examination EYES:  Conjunctivae clear, PERRL, EOMI ENT: normal nose; no rhinorrhea; moist mucous membranes; pharynx without lesions noted; no dental injury; no septal hematoma NECK: Supple, no meningismus, no LAD; no midline spinal tenderness, step-off or deformity; trachea midline CARD: RRR; S1 and S2 appreciated; no murmurs, no clicks, no rubs, no gallops RESP: Normal chest excursion without splinting or tachypnea; breath sounds clear and equal bilaterally; no wheezes, no rhonchi, no rales; no hypoxia or respiratory distress CHEST:  chest wall stable, no crepitus or ecchymosis or deformity, nontender to palpation; no flail chest ABD/GI: Normal bowel sounds; non-distended; soft, non-tender, no rebound, no guarding; no ecchymosis or other lesions noted PELVIS:  stable, nontender to palpation BACK:  The back appears normal and is non-tender to palpation, there is no CVA tenderness; no midline spinal tenderness, step-off or deformity EXT: Patient does have a 4 cm laceration to the lateral aspect of the left hand that goes into the lateral aspect of the left fifth finger with no obvious tendon involvement full range of motion in this joint.  She also has a skin tear to the left lateral wrist with swelling and ecchymosis to the lateral wrist.  She has a skin tear without bony tenderness to the lateral right elbow.  Normal ROM in all joints; otherwise extremities are non-tender to palpation; no edema; normal capillary refill; no cyanosis, no bony tenderness or bony deformity of patient's extremities, no joint effusion, compartments are soft, extremities are warm and well-perfused, 2+ DP pulses bilaterally, some ecchymosis noted to the left leg which she reports is old SKIN: Normal color for age and race; warm NEURO: Moves all extremities equally; sensation to light touch intact diffusely, cranial nerves II through XII intact, normal speech PSYCH: The patient's mood and manner are appropriate. Grooming and personal hygiene are  appropriate.  MEDICAL DECISION MAKING: Patient here with what sounds like a mechanical fall at her nursing facility. Tetanus vaccination is up-to-date.  Multiple skin tears.  She has a laceration to her left hand that will need repair.  Will obtain x-rays of the left hand and wrist.  Will obtain CT of the head and cervical spine.  Hemodynamically stable and at her neurologic baseline.  ED PROGRESS: Imaging shows no acute abnormality.  Jodi Geralds, PA has repaired patient's hand laceration.  Will discharge back to nursing facility.  Patient hemodynamically stable.  No complaints at this time.   At this time, I do not feel there is any life-threatening condition present. I have reviewed and discussed all results (EKG, imaging, lab, urine as appropriate) and exam findings with patient/family. I have reviewed nursing notes and appropriate previous records.  I feel the patient is safe to be discharged home without further emergent workup and can continue workup as an outpatient as  needed. Discussed usual and customary return precautions. Patient/family verbalize understanding and are comfortable with this plan.  Outpatient follow-up has been provided if needed. All questions have been answered.      Ward, Layla MawKristen N, DO 11/24/17 [phone]

## 2017-11-24 NOTE — ED Provider Notes (Signed)
..  Laceration Repair Date/Time: 11/24/2017 8:07 AM Performed by: Dartha LodgeFord, Reigna Ruperto N, PA-C Authorized by: Dartha LodgeFord, Jabree Pernice N, PA-C   Consent:    Consent obtained:  Verbal   Consent given by:  Patient   Risks discussed:  Pain, infection, poor cosmetic result and poor wound healing   Alternatives discussed:  No treatment Anesthesia (see MAR for exact dosages):    Anesthesia method:  Local infiltration   Local anesthetic:  Lidocaine 1% w/o epi Laceration details:    Location:  Hand   Hand location:  L hand, dorsum   Length (cm):  3   Depth (mm):  5 Repair type:    Repair type:  Simple Pre-procedure details:    Preparation:  Patient was prepped and draped in usual sterile fashion Exploration:    Hemostasis achieved with:  Direct pressure   Wound exploration: wound explored through full range of motion and entire depth of wound probed and visualized     Wound extent: areolar tissue violated   Treatment:    Area cleansed with:  Saline and Shur-Clens   Amount of cleaning:  Standard   Irrigation solution:  Sterile saline   Irrigation method:  Syringe Skin repair:    Repair method:  Sutures and Steri-Strips   Suture size:  4-0   Suture material:  Prolene   Suture technique:  Simple interrupted   Number of sutures:  5   Number of Steri-Strips:  2 (Steri-Strips placed longitudinally along the wound to allow for greater skin strength and sutures placed through the Steri-Strips) Approximation:    Approximation:  Close Post-procedure details:    Dressing:  Antibiotic ointment and bulky dressing   Patient tolerance of procedure:  Tolerated well, no immediate complications      Dartha LodgeFord, Yolanda Dockendorf N, [place] JerseyPA-C 11/24/17 [id number]

## 2018-04-30 IMAGING — CR DG SHOULDER 2+V*L*
3 series · 3 of 3 positions shown · non-contrast
Comparison: None.

CLINICAL DATA: Acute left shoulder pain following fall. Initial
encounter.

EXAM:
LEFT SHOULDER - 2+ VIEW

[x shoulder ap left (1 of 3)]
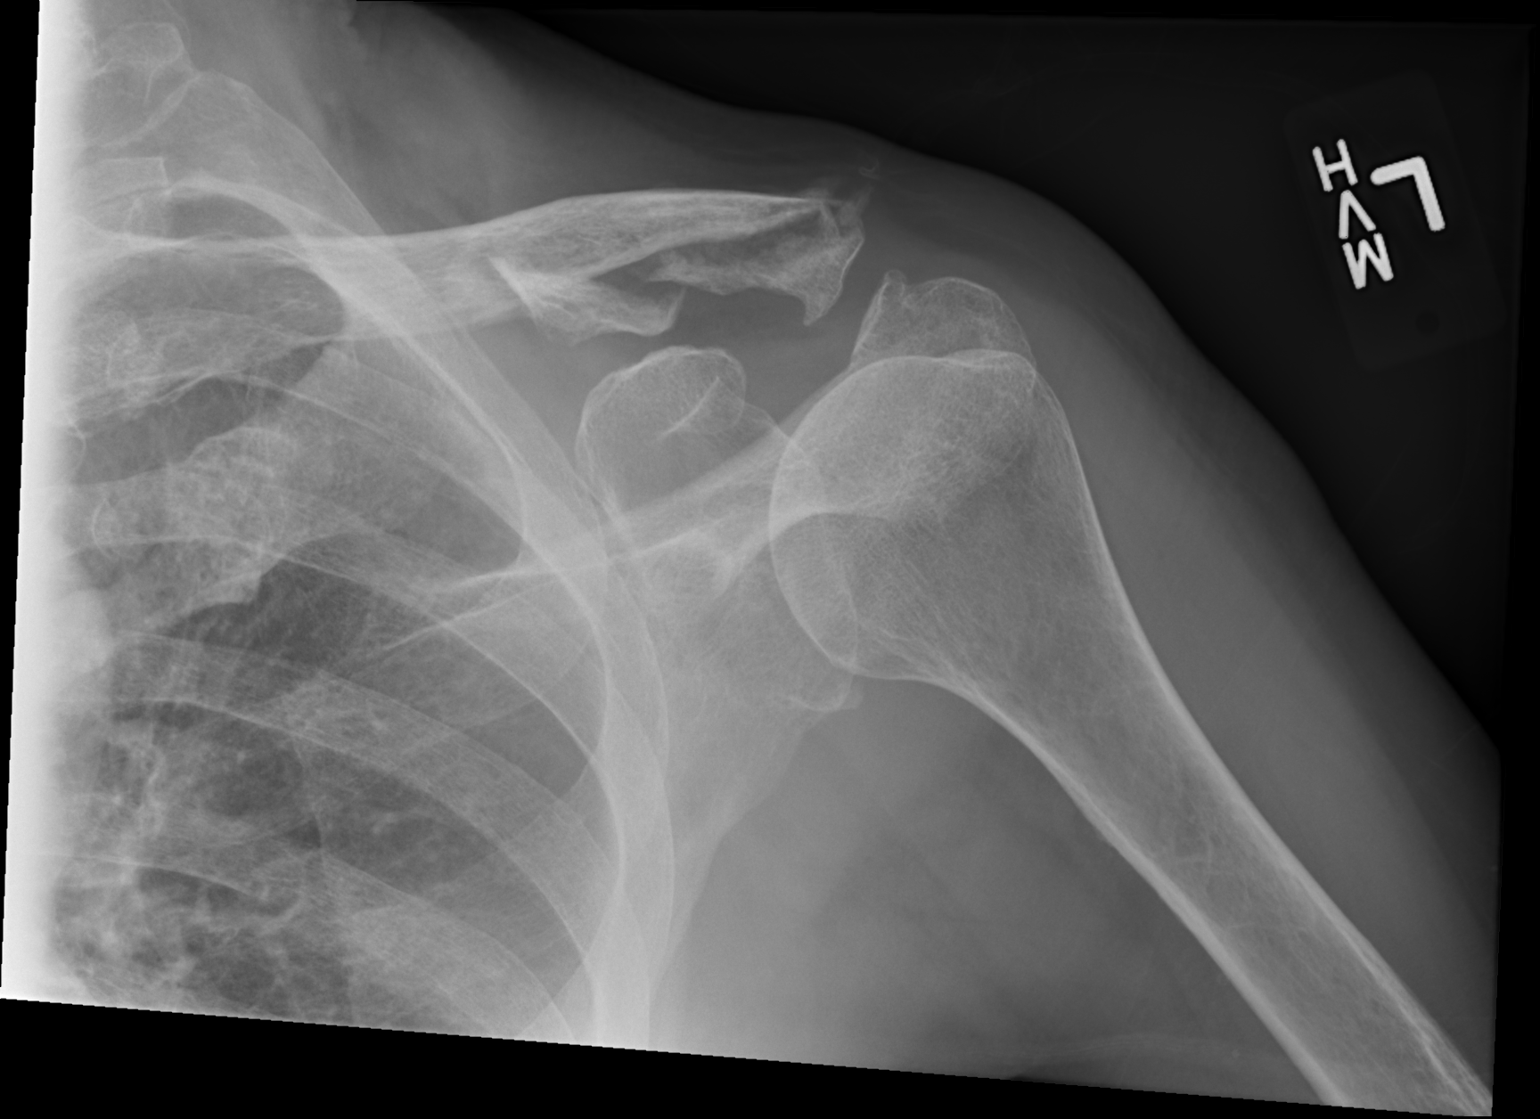

[x shoulder ap left (2 of 3)]
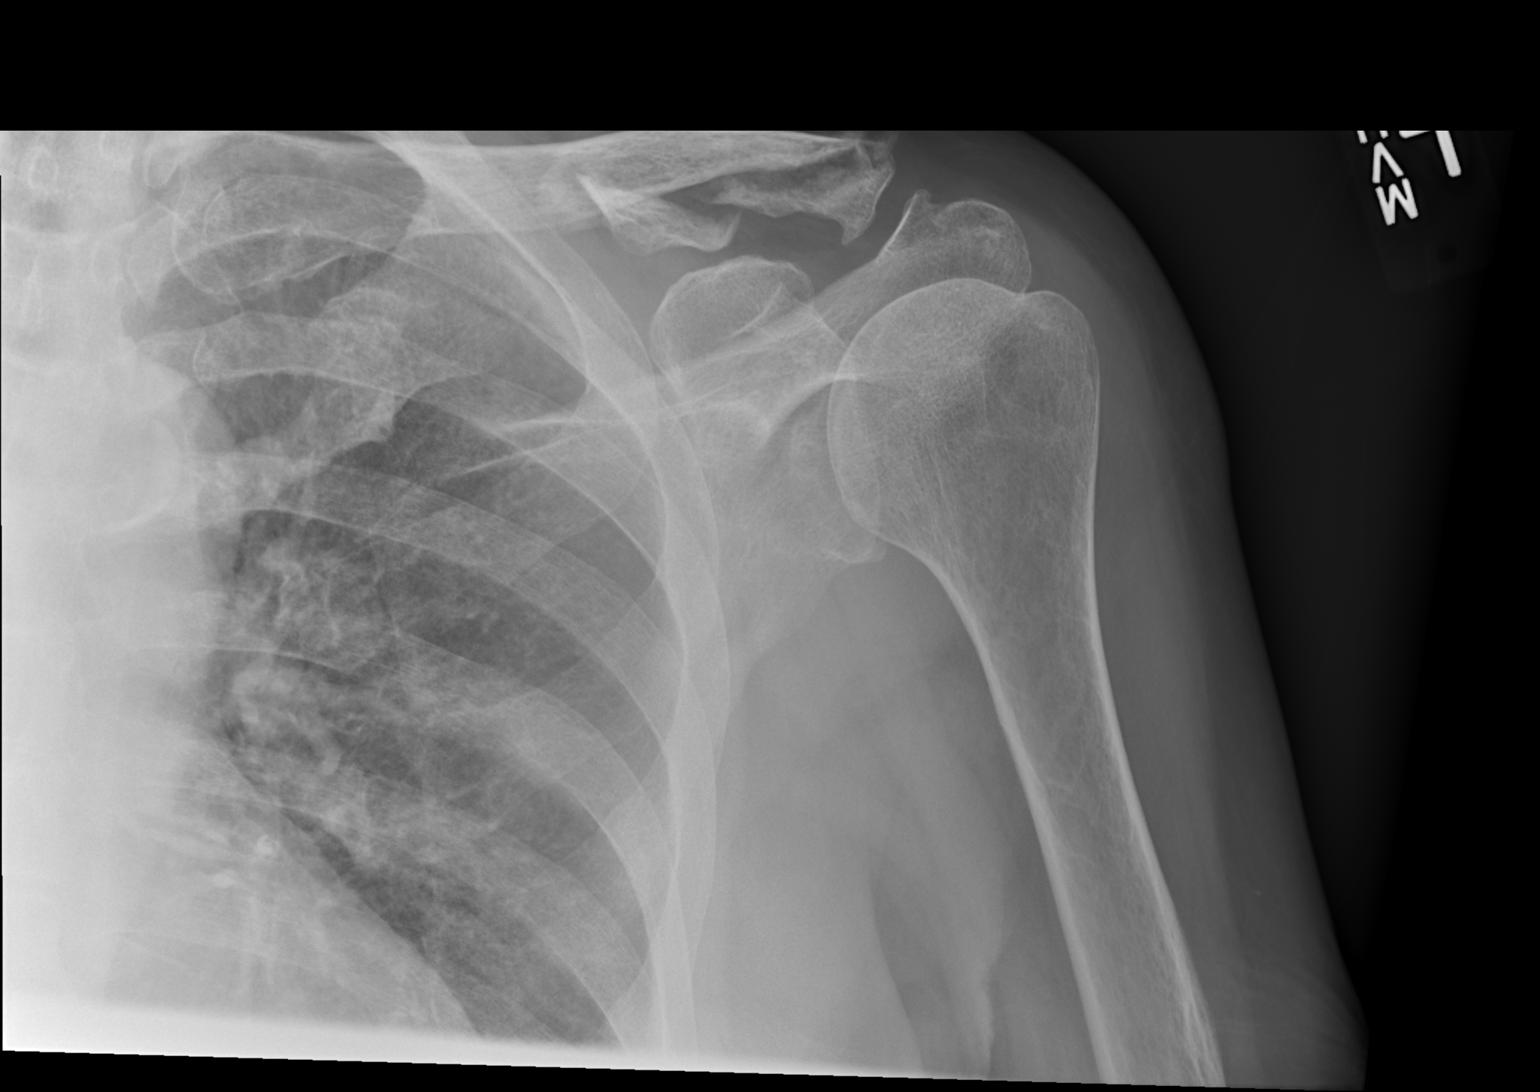

[x shoulder ap left (3 of 3)]
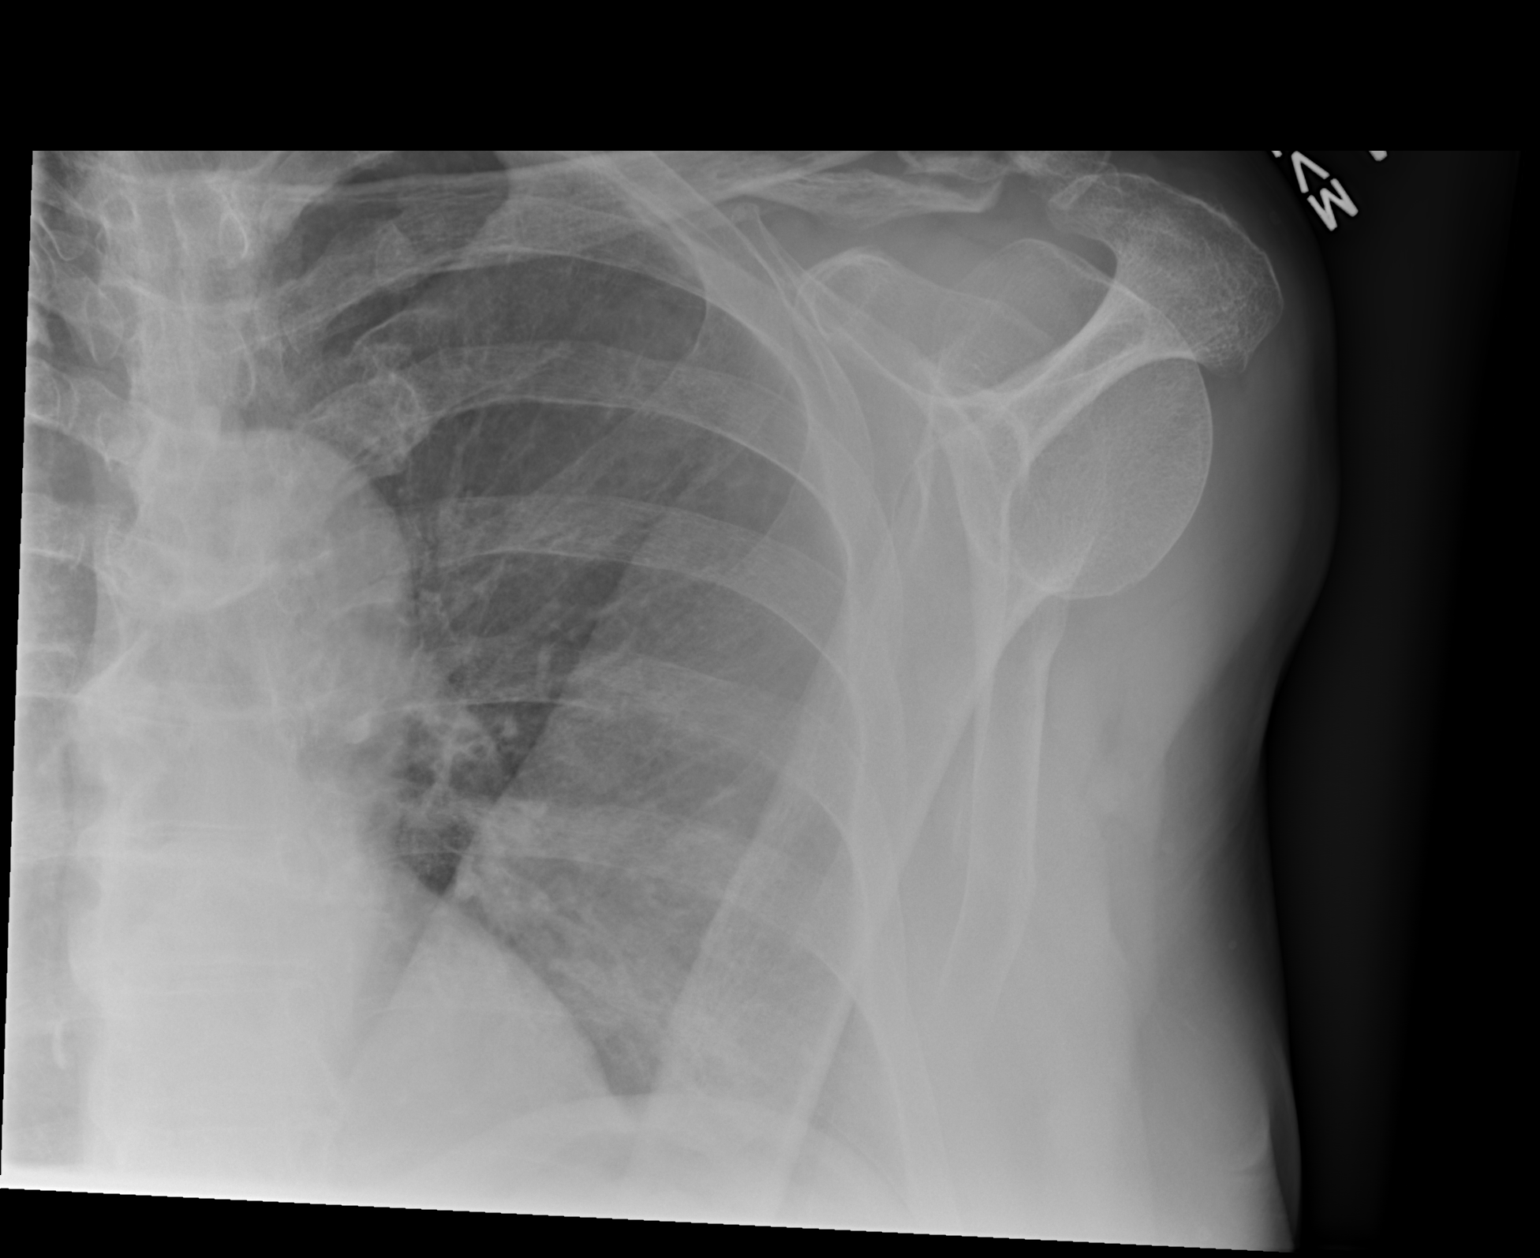

[3 of 3 positions shown; findings below may reference images not displayed]

FINDINGS: A comminuted fracture of the distal clavicle is noted with apparent
extension to the superior AC joint.

No evidence of subluxation or dislocation.

No other significant abnormalities noted.
IMPRESSION: Comminuted fracture of the distal left clavicle.

## 2018-04-30 IMAGING — CR DG WRIST COMPLETE 3+V*L*
4 series · 4 of 4 positions shown · non-contrast
Comparison: None.

CLINICAL DATA: Acute left wrist pain following fall yesterday.
Initial encounter.

EXAM:
LEFT WRIST - COMPLETE 3+ VIEW

[x wrist pa left]
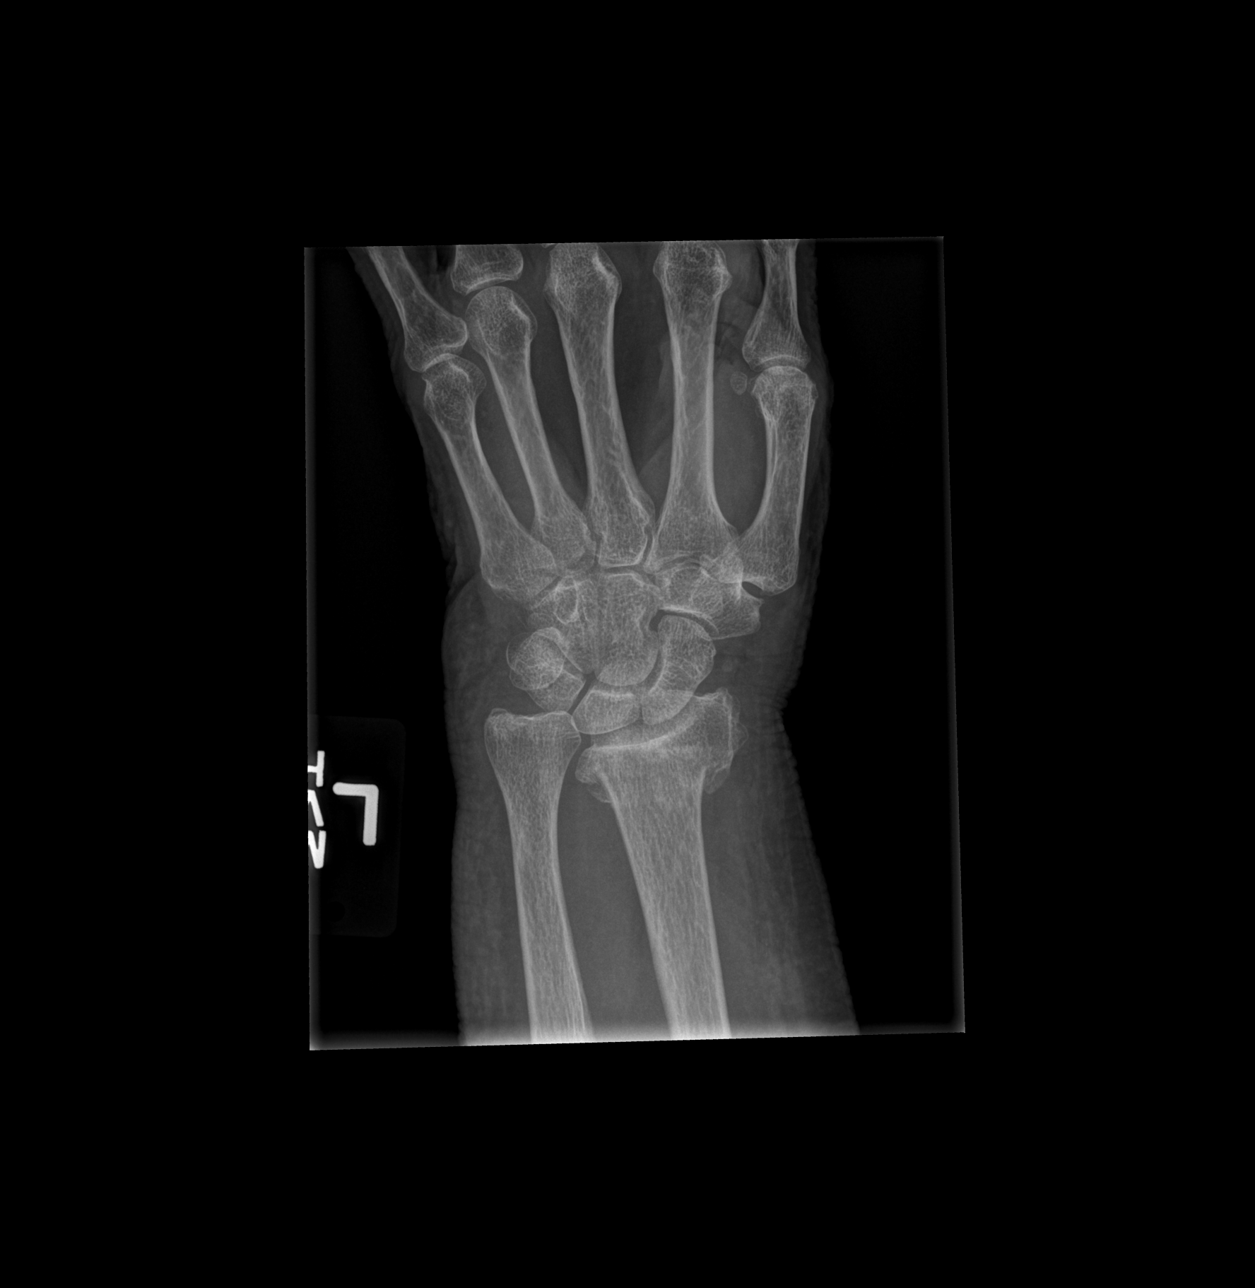

[x wrist obl left]
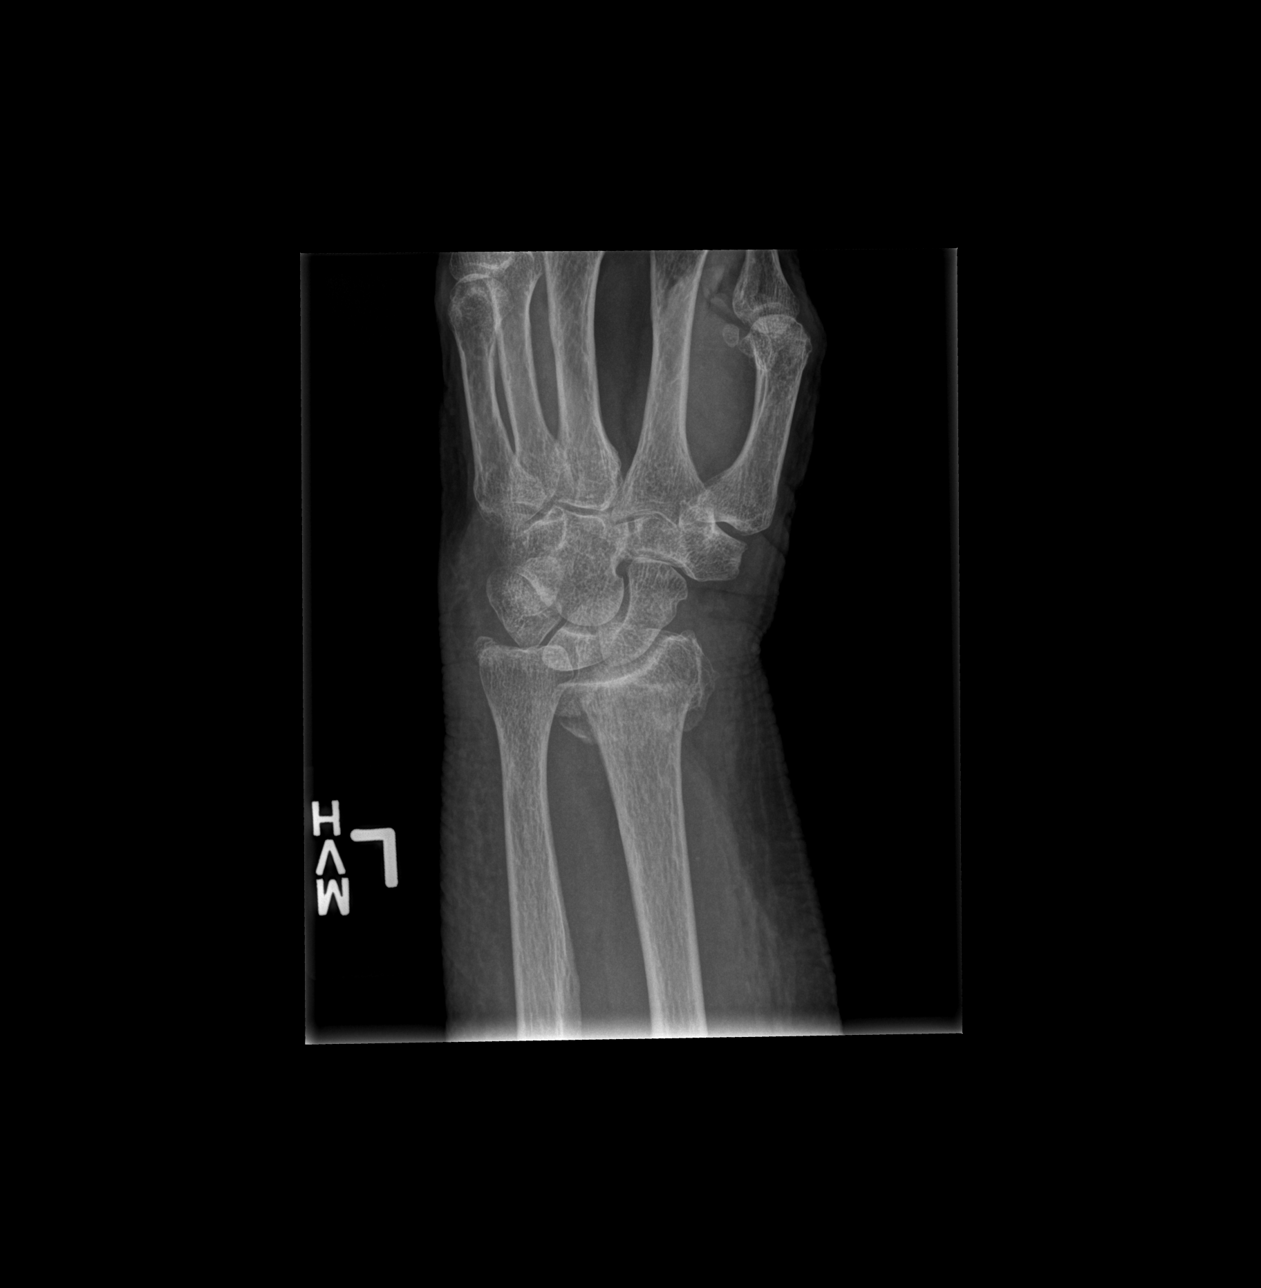

[x wrist lat left]
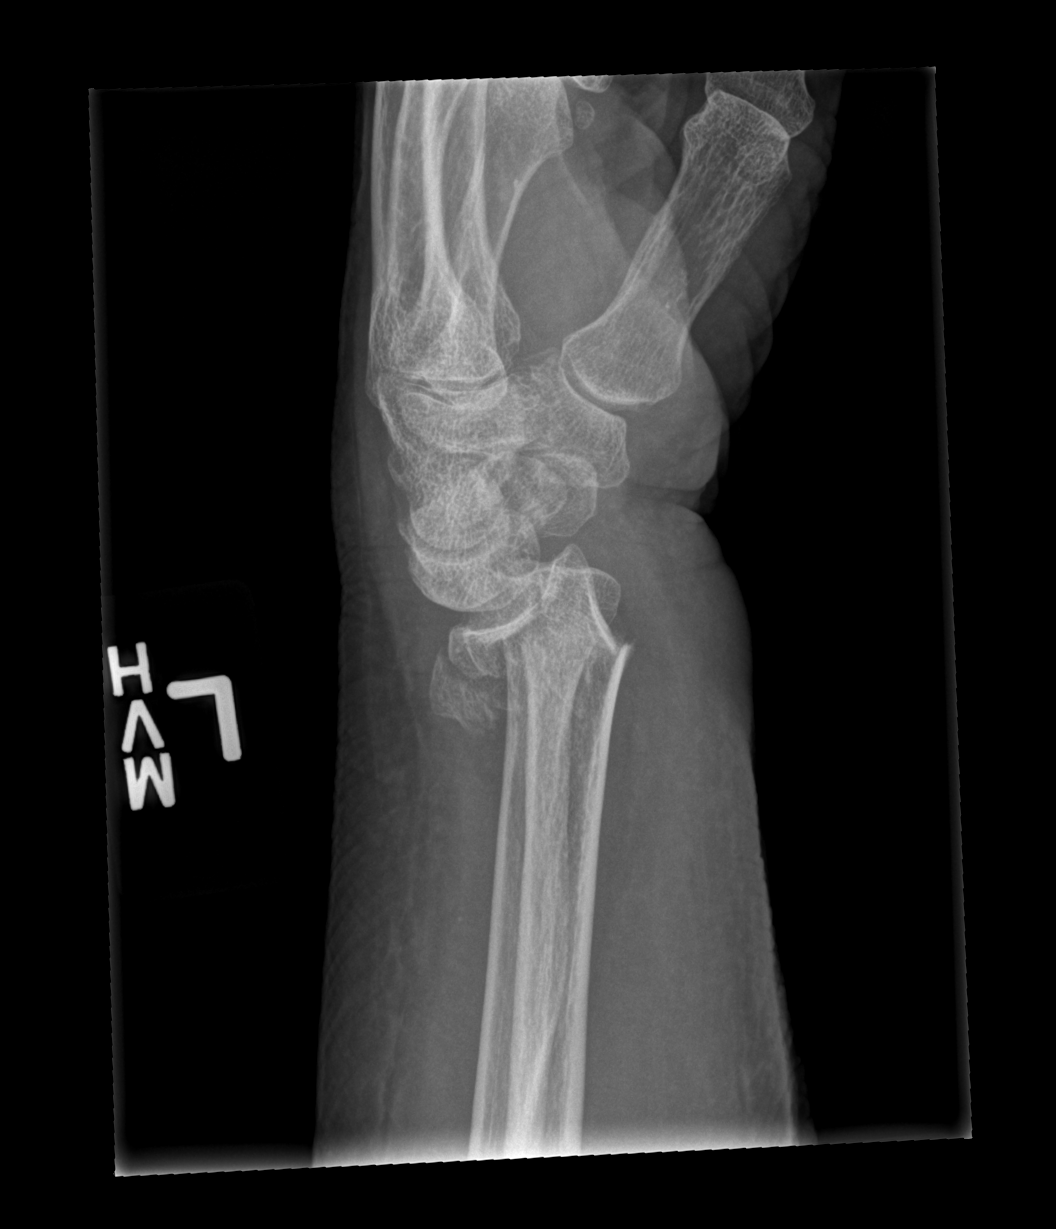

[x wrist navicular view left]
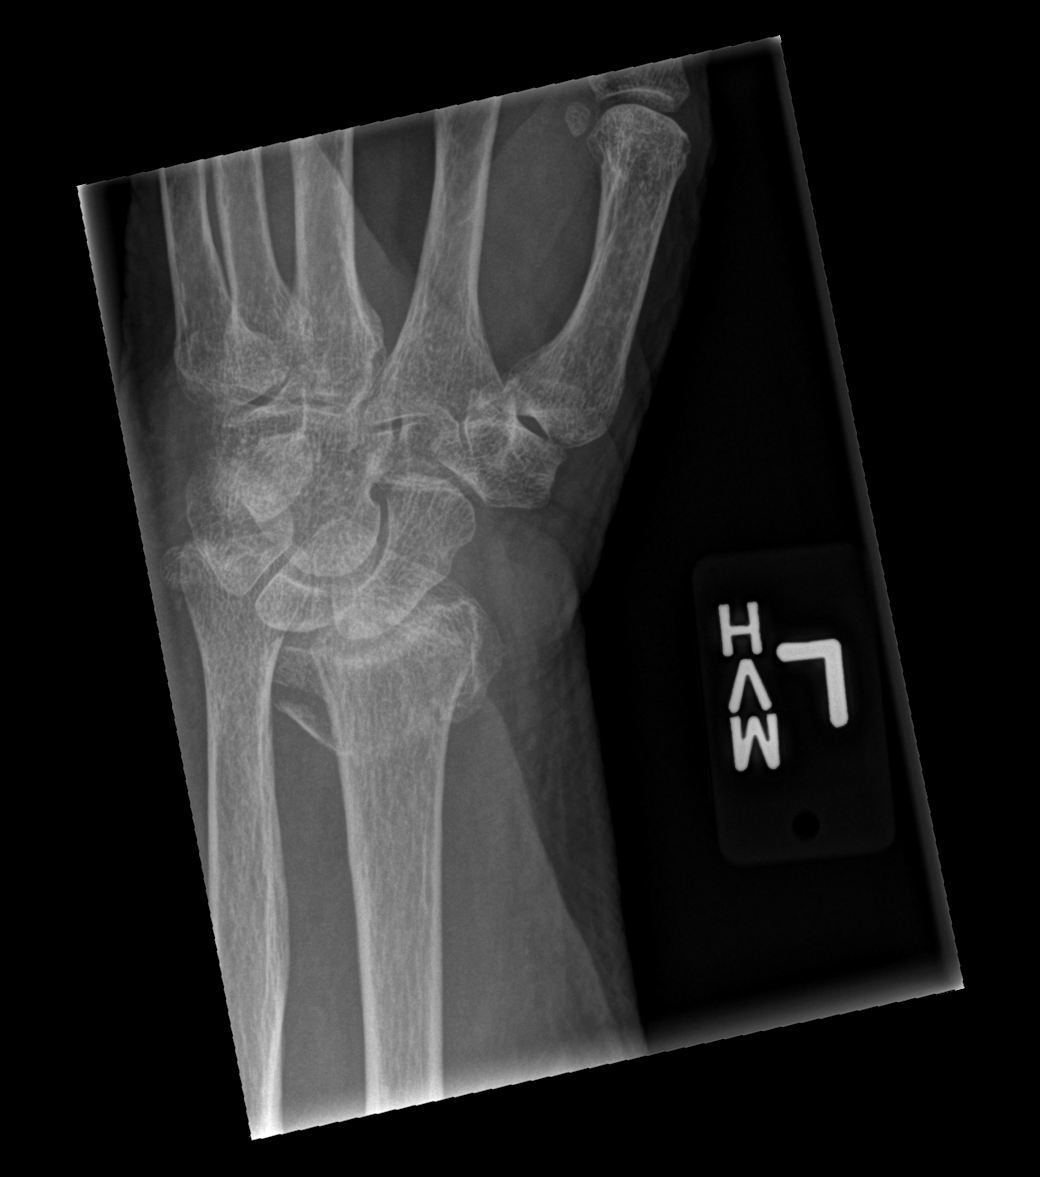

[4 of 4 positions shown; findings below may reference images not displayed]

FINDINGS: A comminuted impacted intra-articular distal radial fracture is
noted with apex volar angulation.

An ulnar styloid fracture is present.

There is no evidence of dislocation.

No focal bony lesions are present.
IMPRESSION: Comminuted impacted and angulated intraarticular distal radial
fracture.

Ulnar styloid fracture.

## 2018-08-25 ENCOUNTER — Non-Acute Institutional Stay: Payer: Medicare Other | Admitting: Internal Medicine

## 2018-08-25 DIAGNOSIS — Z515 Encounter for palliative care: Secondary | ICD-10-CM

## 2018-08-27 NOTE — Progress Notes (Signed)
[facility] Telephone: [phone] Fax: [phone]  PATIENT NAME: Brandy Martinez DOB: May 26, 1932 MRN: [id number]  PRIMARY CARE PROVIDER:   Dr. Blenda Mounts  REFERRING PROVIDER: Reuel Boom, DNP ([facility])  RESPONSIBLE PARTY:  [name] (nephew) [phone]   RECOMMENDATIONS and PLAN:  1.  Palliative Encounter Z51.5  -Advanced Care Planning:  No family at bedside.  Patient unable to make her own decisions due to cognitive deficits. Plan on phone discussion with POA to address goals of care, code status and MOST form selections.  -Memory Loss with behavioral disturbance:  Related to Parkinson disease. FAST stage 7b.  Monitor dosing of Depakote since no improvement with use of Haldol. Avoid over sedation. Minimize use of Ativan. Continue to provide supportive care  -Weakness with resulting falls:  Related to advanced Parkinson disease, anemia.  Consider restorative therapy/ ROM. Geri recliner chair. Continue to monitor anemia panel monthly.  I spent 35 minutes providing this consultation,  from 4:00pm to 4:35pm at pt bedside and nurse's station. More than 50% of the time in this consultation was spent coordinating communication with patient, Brandy Abbe, NP, DON and clinical staff.   HISTORY OF PRESENT ILLNESS:  Brandy Martinez is a 85 y.o. year old female with multiple medical problems including Parkinson disease with behaviors, recurrent falls.  Staff reports that she has been yelling out on a daily basis.  She requires total care from staff.  Palliative Care was asked to help address goals of care.   CODE STATUS:  FULL CODE  PPS: 30% HOSPICE ELIGIBILITY/DIAGNOSIS: TBD  PAST MEDICAL HISTORY:  Past Medical History:  Diagnosis Date  . Anemia   . B12 deficiency   . Delirium   . Diabetes mellitus without complication (HCC)   . Elevated TSH 06/18/2016  . Generalized weakness   . Hyperlipidemia   . Hypertension   . Idiopathic peripheral neuropathy   . Inability  to walk   . Mobility impaired   . Neurologic gait dysfunction   . Parkinson's disease (HCC)      PHYSICAL EXAM:   General: Chronically ill appearing. In NAD Cardiovascular: regular rate and rhythm Pulmonary: clear ant fields Abdomen: soft, nontender, + bowel sounds GU: no suprapubic tenderness Extremities: no edema, no joint deformities Skin: exposed skin is intact Neurological: Alert and oriented x 1, generalized weakness Psych:  Anxious with expressing desire to get OOB  Brandy Sheffield, NP-C

## 2018-08-28 DIAGNOSIS — Z515 Encounter for palliative care: Secondary | ICD-10-CM | POA: Insufficient documentation

## 2018-08-31 ENCOUNTER — Non-Acute Institutional Stay: Payer: Medicare Other | Admitting: Internal Medicine

## 2018-08-31 DIAGNOSIS — Z515 Encounter for palliative care: Secondary | ICD-10-CM

## 2018-08-31 NOTE — Progress Notes (Signed)
[facility] Telephone: [phone] Fax: [phone]  PATIENT NAME: Brandy Martinez DOB: 01/07/1932 MRN: [id number]  PRIMARY CARE PROVIDER:   Dr. Blenda MountsKhashana Blake  REFERRING PROVIDER: Reuel BoomQueeneth Mbemena, DNP ([facility])  RESPONSIBLE PARTY:  Willy EddyRichard Austin (nephew) [phone]   RECOMMENDATIONS and PLAN:   Palliative Encounter Z51.5   1-  Memory Loss with behavioral disturbance:  Related to advancedParkinson disease with dementia.  FAST stage 7c. Declining cognitively and functionally. Increased weakness.  Continue to provide supportive care while attempting to determine disposition status with POA.  2-  Advanced Care Planning:  No family at bedside.  Patient unable to make her own decisions due to cognitive deficits. Plan on phone discussion with POA to address goals of care, code status and MOST form selections. Message was left for Richard Austin/POA to call this NP for pt updates.  Phone call to niece Drucilla SchmidtCindy Bean and updated on pt. Status.  Requested a phone discussion with she and her brother for ACP and code status decision making due to continuous decline of pt. She stated that she would also contact her brother in reference to above.  He can contact NP Merril AbbeQueeneth or myself.  Contact information provided. Transition to Hospice care recommened.  I spent 40 minutes providing this consultation,  from 1400 to 1440 at pt bedside and nurse's station. More than 50% of the time in this consultation was spent coordinating communication with patient, Merril AbbeQueeneth, NP,  and clinical staff.   HISTORY OF PRESENT ILLNESS: Follow up with Brandy Martinez.  Staff reports that she is refusing nourishment, medications and is only sipping liquids.  She is now bedbound requires total care from staff. Palliative Care was asked to help address goals of care.   CODE STATUS:  FULL CODE  PPS: 20% weak HOSPICE ELIGIBILITY/DIAGNOSIS: TBD  PAST MEDICAL HISTORY:  Past Medical History:  Diagnosis Date  .  Anemia   . B12 deficiency   . Delirium   . Diabetes mellitus without complication (HCC)   . Elevated TSH 06/18/2016  . Generalized weakness   . Hyperlipidemia   . Hypertension   . Idiopathic peripheral neuropathy   . Inability to walk   . Mobility impaired   . Neurologic gait dysfunction   . Parkinson's disease (HCC)      PHYSICAL EXAM:   General: Chronically ill and frail appearing while reclining in bed.  Cardiovascular: regular rate and rhythm.  IV fluids infusing Pulmonary: clear ant fields Abdomen: soft, nontender, + bowel sounds Extremities: no edema or ecchymosis Skin: exposed skin is intact Neurological: Somnolent without opening eyes, generalized weakness  Brandy SheffieldShirley Eirene Rather, NP-C

## 2018-09-02 ENCOUNTER — Inpatient Hospital Stay (HOSPITAL_COMMUNITY)
Admission: EM | Admit: 2018-09-02 | Discharge: 2018-09-06 | DRG: 056 | Disposition: A | Discharge status: Skilled Nursing Facility | Payer: Medicare Other | Attending: Internal Medicine | Admitting: Internal Medicine

## 2018-09-02 DIAGNOSIS — F05 Delirium due to known physiological condition: Secondary | ICD-10-CM | POA: Diagnosis not present

## 2018-09-02 DIAGNOSIS — Z79899 Other long term (current) drug therapy: Secondary | ICD-10-CM

## 2018-09-02 DIAGNOSIS — G20 Parkinson's disease: Secondary | ICD-10-CM | POA: Diagnosis not present

## 2018-09-02 DIAGNOSIS — G9341 Metabolic encephalopathy: Secondary | ICD-10-CM | POA: Diagnosis present

## 2018-09-02 DIAGNOSIS — R627 Adult failure to thrive: Secondary | ICD-10-CM | POA: Diagnosis not present

## 2018-09-02 DIAGNOSIS — E785 Hyperlipidemia, unspecified: Secondary | ICD-10-CM | POA: Diagnosis present

## 2018-09-02 DIAGNOSIS — Z833 Family history of diabetes mellitus: Secondary | ICD-10-CM

## 2018-09-02 DIAGNOSIS — Z885 Allergy status to narcotic agent status: Secondary | ICD-10-CM

## 2018-09-02 DIAGNOSIS — R400 Somnolence: Secondary | ICD-10-CM

## 2018-09-02 DIAGNOSIS — N390 Urinary tract infection, site not specified: Secondary | ICD-10-CM | POA: Diagnosis present

## 2018-09-02 DIAGNOSIS — E039 Hypothyroidism, unspecified: Secondary | ICD-10-CM | POA: Diagnosis present

## 2018-09-02 DIAGNOSIS — E119 Type 2 diabetes mellitus without complications: Secondary | ICD-10-CM | POA: Diagnosis present

## 2018-09-02 DIAGNOSIS — E860 Dehydration: Secondary | ICD-10-CM | POA: Diagnosis present

## 2018-09-02 DIAGNOSIS — Z515 Encounter for palliative care: Secondary | ICD-10-CM

## 2018-09-02 DIAGNOSIS — Z79890 Hormone replacement therapy (postmenopausal): Secondary | ICD-10-CM

## 2018-09-02 DIAGNOSIS — Z993 Dependence on wheelchair: Secondary | ICD-10-CM

## 2018-09-02 DIAGNOSIS — Z6824 Body mass index (BMI) 24.0-24.9, adult: Secondary | ICD-10-CM

## 2018-09-02 DIAGNOSIS — L89626 Pressure-induced deep tissue damage of left heel: Secondary | ICD-10-CM | POA: Diagnosis present

## 2018-09-02 DIAGNOSIS — I10 Essential (primary) hypertension: Secondary | ICD-10-CM | POA: Diagnosis present

## 2018-09-02 DIAGNOSIS — Z87891 Personal history of nicotine dependence: Secondary | ICD-10-CM

## 2018-09-02 DIAGNOSIS — Z888 Allergy status to other drugs, medicaments and biological substances status: Secondary | ICD-10-CM

## 2018-09-02 DIAGNOSIS — G609 Hereditary and idiopathic neuropathy, unspecified: Secondary | ICD-10-CM | POA: Diagnosis present

## 2018-09-02 DIAGNOSIS — R319 Hematuria, unspecified: Secondary | ICD-10-CM | POA: Diagnosis present

## 2018-09-02 DIAGNOSIS — Z8249 Family history of ischemic heart disease and other diseases of the circulatory system: Secondary | ICD-10-CM

## 2018-09-02 DIAGNOSIS — S81802A Unspecified open wound, left lower leg, initial encounter: Secondary | ICD-10-CM | POA: Diagnosis present

## 2018-09-02 DIAGNOSIS — R410 Disorientation, unspecified: Secondary | ICD-10-CM | POA: Diagnosis present

## 2018-09-02 DIAGNOSIS — Z7982 Long term (current) use of aspirin: Secondary | ICD-10-CM

## 2018-09-02 DIAGNOSIS — L8990 Pressure ulcer of unspecified site, unspecified stage: Secondary | ICD-10-CM

## 2018-09-02 NOTE — ED Triage Notes (Addendum)
Pt bib EMS from [facility] for Failure To Thrive. Per EMS, the nephew noticed her talking about family members who has passed on, and the facility sts she has not been eating much over the past week. Pt has been weak and lethargic.Brandy Martinez. Pt is not a DNR. Pt AAOX3.

## 2018-09-02 NOTE — ED Notes (Signed)
Bed: WA07 Expected date:  Expected time:  Means of arrival:  Comments: EMS 85 yo female from facility failure to thrive 150/76 HR 90

## 2018-09-03 ENCOUNTER — Encounter (HOSPITAL_COMMUNITY): Payer: Self-pay

## 2018-09-03 ENCOUNTER — Other Ambulatory Visit: Payer: Self-pay

## 2018-09-03 DIAGNOSIS — I10 Essential (primary) hypertension: Secondary | ICD-10-CM | POA: Diagnosis present

## 2018-09-03 DIAGNOSIS — Z79899 Other long term (current) drug therapy: Secondary | ICD-10-CM | POA: Diagnosis not present

## 2018-09-03 DIAGNOSIS — G20 Parkinson's disease: Secondary | ICD-10-CM | POA: Diagnosis present

## 2018-09-03 DIAGNOSIS — Z7189 Other specified counseling: Secondary | ICD-10-CM | POA: Diagnosis not present

## 2018-09-03 DIAGNOSIS — R627 Adult failure to thrive: Secondary | ICD-10-CM

## 2018-09-03 DIAGNOSIS — G9341 Metabolic encephalopathy: Secondary | ICD-10-CM | POA: Diagnosis present

## 2018-09-03 DIAGNOSIS — R400 Somnolence: Secondary | ICD-10-CM | POA: Diagnosis not present

## 2018-09-03 DIAGNOSIS — E785 Hyperlipidemia, unspecified: Secondary | ICD-10-CM | POA: Diagnosis present

## 2018-09-03 DIAGNOSIS — Z7982 Long term (current) use of aspirin: Secondary | ICD-10-CM | POA: Diagnosis not present

## 2018-09-03 DIAGNOSIS — R319 Hematuria, unspecified: Secondary | ICD-10-CM | POA: Diagnosis present

## 2018-09-03 DIAGNOSIS — Z885 Allergy status to narcotic agent status: Secondary | ICD-10-CM | POA: Diagnosis not present

## 2018-09-03 DIAGNOSIS — N390 Urinary tract infection, site not specified: Secondary | ICD-10-CM | POA: Diagnosis present

## 2018-09-03 DIAGNOSIS — F05 Delirium due to known physiological condition: Secondary | ICD-10-CM | POA: Diagnosis present

## 2018-09-03 DIAGNOSIS — R410 Disorientation, unspecified: Secondary | ICD-10-CM

## 2018-09-03 DIAGNOSIS — Z888 Allergy status to other drugs, medicaments and biological substances status: Secondary | ICD-10-CM | POA: Diagnosis not present

## 2018-09-03 DIAGNOSIS — Z6824 Body mass index (BMI) 24.0-24.9, adult: Secondary | ICD-10-CM | POA: Diagnosis not present

## 2018-09-03 DIAGNOSIS — E860 Dehydration: Secondary | ICD-10-CM | POA: Diagnosis present

## 2018-09-03 DIAGNOSIS — N3001 Acute cystitis with hematuria: Secondary | ICD-10-CM | POA: Diagnosis not present

## 2018-09-03 DIAGNOSIS — G609 Hereditary and idiopathic neuropathy, unspecified: Secondary | ICD-10-CM | POA: Diagnosis present

## 2018-09-03 DIAGNOSIS — Z87891 Personal history of nicotine dependence: Secondary | ICD-10-CM | POA: Diagnosis not present

## 2018-09-03 DIAGNOSIS — E039 Hypothyroidism, unspecified: Secondary | ICD-10-CM | POA: Diagnosis present

## 2018-09-03 DIAGNOSIS — L8990 Pressure ulcer of unspecified site, unspecified stage: Secondary | ICD-10-CM

## 2018-09-03 DIAGNOSIS — Z833 Family history of diabetes mellitus: Secondary | ICD-10-CM | POA: Diagnosis not present

## 2018-09-03 DIAGNOSIS — Z515 Encounter for palliative care: Secondary | ICD-10-CM | POA: Diagnosis not present

## 2018-09-03 DIAGNOSIS — Z993 Dependence on wheelchair: Secondary | ICD-10-CM | POA: Diagnosis not present

## 2018-09-03 DIAGNOSIS — E119 Type 2 diabetes mellitus without complications: Secondary | ICD-10-CM | POA: Diagnosis present

## 2018-09-03 DIAGNOSIS — S81802A Unspecified open wound, left lower leg, initial encounter: Secondary | ICD-10-CM | POA: Diagnosis present

## 2018-09-03 DIAGNOSIS — Z79890 Hormone replacement therapy (postmenopausal): Secondary | ICD-10-CM | POA: Diagnosis not present

## 2018-09-03 DIAGNOSIS — Z8249 Family history of ischemic heart disease and other diseases of the circulatory system: Secondary | ICD-10-CM | POA: Diagnosis not present

## 2018-09-03 DIAGNOSIS — L89626 Pressure-induced deep tissue damage of left heel: Secondary | ICD-10-CM | POA: Diagnosis present

## 2018-09-03 LAB — URINALYSIS, ROUTINE W REFLEX MICROSCOPIC
Bilirubin Urine: NEGATIVE
Glucose, UA: 50 mg/dL — AB
Ketones, ur: 20 mg/dL — AB
Nitrite: NEGATIVE
PROTEIN: 100 mg/dL — AB
Specific Gravity, Urine: 1.033 — ABNORMAL HIGH (ref 1.005–1.030)
pH: 5 (ref 5.0–8.0)

## 2018-09-03 LAB — CBC WITH DIFFERENTIAL/PLATELET
Abs Immature Granulocytes: 0.08 10*3/uL — ABNORMAL HIGH (ref 0.00–0.07)
Basophils Absolute: 0 10*3/uL (ref 0.0–0.1)
Basophils Relative: 0 %
EOS PCT: 2 %
Eosinophils Absolute: 0.2 10*3/uL (ref 0.0–0.5)
HCT: 38.8 % (ref 36.0–46.0)
Hemoglobin: 12.5 g/dL (ref 12.0–15.0)
Immature Granulocytes: 1 %
LYMPHS PCT: 22 %
Lymphs Abs: 2.3 10*3/uL (ref 0.7–4.0)
MCH: 32.5 pg (ref 26.0–34.0)
MCHC: 32.2 g/dL (ref 30.0–36.0)
MCV: 100.8 fL — ABNORMAL HIGH (ref 80.0–100.0)
Monocytes Absolute: 0.7 10*3/uL (ref 0.1–1.0)
Monocytes Relative: 7 %
Neutro Abs: 7.3 10*3/uL (ref 1.7–7.7)
Neutrophils Relative %: 68 %
Platelets: 205 10*3/uL (ref 150–400)
RBC: 3.85 MIL/uL — ABNORMAL LOW (ref 3.87–5.11)
RDW: 12.1 % (ref 11.5–15.5)
WBC: 10.7 10*3/uL — AB (ref 4.0–10.5)
nRBC: 0 % (ref 0.0–0.2)

## 2018-09-03 LAB — T4, FREE: Free T4: 0.83 ng/dL (ref 0.82–1.77)

## 2018-09-03 LAB — COMPREHENSIVE METABOLIC PANEL
ALT: 38 U/L (ref 0–44)
AST: 39 U/L (ref 15–41)
Albumin: 2.9 g/dL — ABNORMAL LOW (ref 3.5–5.0)
Alkaline Phosphatase: 55 U/L (ref 38–126)
Anion gap: 7 (ref 5–15)
BUN: 28 mg/dL — ABNORMAL HIGH (ref 8–23)
CO2: 22 mmol/L (ref 22–32)
Calcium: 8.6 mg/dL — ABNORMAL LOW (ref 8.9–10.3)
Chloride: 110 mmol/L (ref 98–111)
Creatinine, Ser: 0.72 mg/dL (ref 0.44–1.00)
GFR calc Af Amer: >60 mL/min (ref >60)
GFR calc non Af Amer: >60 mL/min (ref >60)
Glucose, Bld: 227 mg/dL — ABNORMAL HIGH (ref 70–99)
Potassium: 4.1 mmol/L (ref 3.5–5.1)
Sodium: 139 mmol/L (ref 135–145)
Total Bilirubin: 0.6 mg/dL (ref 0.3–1.2)
Total Protein: 6.6 g/dL (ref 6.5–8.1)

## 2018-09-03 LAB — LACTIC ACID, PLASMA
Lactic Acid, Venous: 1.7 mmol/L (ref 0.5–1.9)
Lactic Acid, Venous: 1.4 mmol/L (ref 0.5–1.9)

## 2018-09-03 LAB — TSH: TSH: 15.994 u[IU]/mL — ABNORMAL HIGH (ref 0.350–4.500)

## 2018-09-03 LAB — AMMONIA: Ammonia: 21 umol/L (ref 9–35)

## 2018-09-03 LAB — VALPROIC ACID LEVEL: Valproic Acid Lvl: 30 ug/mL — ABNORMAL LOW (ref 50.0–100.0)

## 2018-09-03 LAB — TROPONIN I: Troponin I: <0.03 ng/mL (ref <0.03)

## 2018-09-03 MED ORDER — SODIUM CHLORIDE 0.9 % IV BOLUS
500.0000 mL | Freq: Once | INTRAVENOUS | Status: AC
  Administered 2018-09-03: 500 mL via INTRAVENOUS

## 2018-09-03 MED ORDER — MIRTAZAPINE 7.5 MG PO TABS: 15.0000 mg | ORAL_TABLET | Freq: Every day | ORAL | Status: DC

## 2018-09-03 MED ORDER — ROPINIROLE HCL 0.5 MG PO TABS: 0.5000 mg | ORAL_TABLET | Freq: Three times a day (TID) | ORAL | Status: DC

## 2018-09-03 MED ORDER — LEVOTHYROXINE SODIUM 88 MCG PO TABS
88.0000 ug | ORAL_TABLET | Freq: Every day | ORAL | Status: DC
  Administered 2018-09-03: 88 ug via ORAL
  Filled 2018-09-03: qty 1

## 2018-09-03 MED ORDER — ROPINIROLE HCL 1 MG PO TABS
0.5000 mg | ORAL_TABLET | Freq: Two times a day (BID) | ORAL | Status: DC
  Administered 2018-09-03 – 2018-09-05 (×6): 0.5 mg via ORAL
  Filled 2018-09-03 (×8): qty 1

## 2018-09-03 MED ORDER — SODIUM CHLORIDE 0.9 % IV SOLN
INTRAVENOUS | Status: AC
  Administered 2018-09-03: 1000 mL via INTRAVENOUS
  Administered 2018-09-03: 06:00:00 via INTRAVENOUS

## 2018-09-03 MED ORDER — MIRTAZAPINE 15 MG PO TABS
7.5000 mg | ORAL_TABLET | Freq: Every day | ORAL | Status: DC
  Administered 2018-09-03 – 2018-09-05 (×3): 7.5 mg via ORAL
  Filled 2018-09-03 (×3): qty 1

## 2018-09-03 MED ORDER — CARBIDOPA-LEVODOPA 25-100 MG PO TABS
1.5000 | ORAL_TABLET | Freq: Three times a day (TID) | ORAL | Status: DC
  Administered 2018-09-03 – 2018-09-05 (×8): 1.5 via ORAL
  Filled 2018-09-03 (×4): qty 2
  Filled 2018-09-03: qty 1.5
  Filled 2018-09-03 (×5): qty 2

## 2018-09-03 MED ORDER — SODIUM CHLORIDE 0.9 % IV SOLN
1.0000 g | Freq: Every day | INTRAVENOUS | Status: DC
  Administered 2018-09-03 – 2018-09-05 (×3): 1 g via INTRAVENOUS
  Filled 2018-09-03 (×2): qty 1
  Filled 2018-09-03: qty 10
  Filled 2018-09-03: qty 1

## 2018-09-03 MED ORDER — SODIUM CHLORIDE 0.9 % IV BOLUS
1000.0000 mL | Freq: Once | INTRAVENOUS | Status: AC
  Administered 2018-09-03: 1000 mL via INTRAVENOUS

## 2018-09-03 MED ORDER — SODIUM CHLORIDE 0.9 % IV SOLN
1.0000 g | Freq: Once | INTRAVENOUS | Status: AC
  Administered 2018-09-03: 1 g via INTRAVENOUS
  Filled 2018-09-03: qty 10

## 2018-09-03 MED ORDER — ENOXAPARIN SODIUM 40 MG/0.4ML ~~LOC~~ SOLN
40.0000 mg | SUBCUTANEOUS | Status: DC
  Administered 2018-09-03 – 2018-09-06 (×4): 40 mg via SUBCUTANEOUS
  Filled 2018-09-03 (×6): qty 0.4

## 2018-09-03 MED ORDER — LEVOTHYROXINE SODIUM 25 MCG PO TABS: 25.0000 ug | ORAL_TABLET | Freq: Once | ORAL | Status: DC

## 2018-09-03 MED ORDER — POLYVINYL ALCOHOL 1.4 % OP SOLN
1.0000 [drp] | Freq: Every day | OPHTHALMIC | Status: DC
  Administered 2018-09-04 – 2018-09-06 (×3): 1 [drp] via OPHTHALMIC
  Filled 2018-09-03: qty 15

## 2018-09-03 MED ORDER — DIVALPROEX SODIUM 250 MG PO DR TAB
250.0000 mg | DELAYED_RELEASE_TABLET | Freq: Two times a day (BID) | ORAL | Status: DC
  Administered 2018-09-03 – 2018-09-05 (×6): 250 mg via ORAL
  Filled 2018-09-03 (×7): qty 1

## 2018-09-03 MED ORDER — DOCUSATE SODIUM 100 MG PO CAPS
100.0000 mg | ORAL_CAPSULE | Freq: Two times a day (BID) | ORAL | Status: DC
  Administered 2018-09-03 – 2018-09-05 (×5): 100 mg via ORAL
  Filled 2018-09-03 (×6): qty 1

## 2018-09-03 MED ORDER — ESCITALOPRAM OXALATE 10 MG PO TABS
10.0000 mg | ORAL_TABLET | Freq: Every day | ORAL | Status: DC
  Administered 2018-09-03 – 2018-09-05 (×3): 10 mg via ORAL
  Filled 2018-09-03 (×4): qty 1

## 2018-09-03 MED ORDER — LEVOTHYROXINE SODIUM 100 MCG PO TABS
100.0000 ug | ORAL_TABLET | Freq: Every day | ORAL | Status: DC
  Administered 2018-09-03 – 2018-09-06 (×4): 100 ug via ORAL
  Filled 2018-09-03 (×4): qty 1

## 2018-09-03 MED ORDER — LISINOPRIL 5 MG PO TABS
2.5000 mg | ORAL_TABLET | Freq: Every day | ORAL | Status: DC
  Administered 2018-09-03 – 2018-09-05 (×3): 2.5 mg via ORAL
  Filled 2018-09-03 (×4): qty 1

## 2018-09-03 MED ORDER — LEVOTHYROXINE SODIUM 100 MCG PO TABS: 100.0000 ug | ORAL_TABLET | Freq: Every day | ORAL | Status: DC

## 2018-09-03 MED ORDER — HALOPERIDOL LACTATE 5 MG/ML IJ SOLN: 2.0000 mg | Freq: Four times a day (QID) | INTRAMUSCULAR | Status: DC | PRN

## 2018-09-03 NOTE — H&P (Signed)
History and Physical  Brandy Martinez OAC:[id number] DOB: 05/21/1932 DOA: 09/02/2018 2349  Referring physician: Lars Mage [facility] ED) PCP: System, Pcp Not In  Outpatient Specialists: [facility]  HISTORY   Chief Complaint: altered mentation from baseline  HPI: Brandy Martinez is a 85 y.o. female with Parkinson's dementia, anemia, hypothyroidism, longterm resident at [facility], currently wheelchair, bound who presented from [facility] at the behest of her nephew (POA) who was concerned that patient has become more altered and agitated from baseline. Has reported that patient is not making sense in conversation and speaking about deceased family members. Also reported that patient has been refusing to eat her meals. Patient tells me that she feels that she has to urinate and feels poorly, but otherwise is unable to provide more detail. Per ED provider, the nephew is concerned that patient is "giving up" after her sister's passing this past December. According to rehab facility notes, patient completed a 5 day course of levofloxacin for presumed UTI. She appears to have a foley catheter placed sometime in the past 1-2 weeks.  Of note, patient's POA (nephew) was reportedly concerned about recent palliative care and code status discussions -- patient currently remains full code. Palliative care note from [facility] on 08/25/18 noted that patient has severe cognitive deficits and weakness thought related to progression of Parkinson's.    Review of Systems:  - no fevers/chills - no cough - no chest pain, dyspnea on exertion - no nausea/vomiting; no tarry, melanotic or bloody stools - rest unable to obtain from chart, rehab notes  Rest of systems reviewed are negative, except as per above history.   ED course:  Vitals Blood pressure (!) 142/73, pulse 82, temperature 98.5 F (36.9 C), temperature source Oral, resp. rate 18, height 5\' 6"  (1.676 m), weight 68 kg, SpO2 98 %. Received  ceftriaxone IV 1gm x 1; NS bolus 1.5 L  Past Medical History:  Diagnosis Date  . Anemia   . B12 deficiency   . Delirium   . Diabetes mellitus without complication (HCC)   . Elevated TSH 06/18/2016  . Generalized weakness   . Hyperlipidemia   . Hypertension   . Idiopathic peripheral neuropathy   . Inability to walk   . Mobility impaired   . Neurologic gait dysfunction   . Parkinson's disease [facility])    Past Surgical History:  Procedure Laterality Date  . APPENDECTOMY      Social History:  reports that she has quit smoking. Her smoking use included cigarettes. She has never used smokeless tobacco. She reports that she does not drink alcohol or use drugs.  Allergies  Allergen Reactions  . Codeine   . Lipitor [Atorvastatin Calcium]     Family History  Problem Relation Age of Onset  . Hypertension Mother   . Hypertension Father   . Heart disease Father   . Heart disease Sister   . Diabetes Sister   . Cancer Sister   . Cancer Brother       Prior to Admission medications   Medication Sig Start Date End Date Taking? Authorizing Provider  acetaminophen (TYLENOL) 325 MG tablet Take 650 mg by mouth every 4 (four) hours as needed for mild pain or moderate pain.   Yes [provider]  alendronate (FOSAMAX) 70 MG tablet Take 70 mg by mouth every Thursday. Take with a full glass of water on an empty stomach.  Take on Thursdays.   Yes [provider]  aspirin EC 81 MG tablet Take 81 mg by mouth daily.   Yes [provider]  carbidopa-levodopa (SINEMET IR) 25-100 MG tablet Take 1.5 tablets by mouth 3 (three) times daily.    Yes [provider]  divalproex (DEPAKOTE) 250 MG DR tablet Take 250 mg by mouth 2 (two) times daily.   Yes [provider]  docusate sodium (COLACE) 100 MG capsule Take 100 mg by mouth 2 (two) times daily.   Yes [provider]  escitalopram (LEXAPRO) 10 MG tablet Take 10 mg by mouth daily.   Yes [provider]  hydroxypropyl methylcellulose / hypromellose (ISOPTO TEARS / GONIOVISC) 2.5 % ophthalmic solution Place 1 drop into both eyes daily.   Yes [provider]  lactulose (CHRONULAC) 10 GM/15ML solution Take 20 g by mouth daily.   Yes [provider]  levofloxacin (LEVAQUIN) 750 MG tablet Take 750 mg by mouth 2 (two) times daily.   Yes [provider]  levothyroxine (SYNTHROID, LEVOTHROID) 88 MCG tablet Take 88 mcg by mouth daily before breakfast.   Yes [provider]  lisinopril (PRINIVIL,ZESTRIL) 2.5 MG tablet Take 2.5 mg by mouth daily.  08/21/17  Yes [provider]  LORazepam (ATIVAN) 0.5 MG tablet Take 0.5 mg by mouth daily as needed for anxiety.   Yes [provider]  Melatonin 3 MG TABS Take 5 mg by mouth at bedtime.    Yes [provider]  memantine (NAMENDA) 10 MG tablet Take 10 mg by mouth 2 (two) times daily.   Yes [provider]  mirtazapine (REMERON) 15 MG tablet Take 15 mg by mouth at bedtime.   Yes [provider]  polyethylene glycol (MIRALAX / GLYCOLAX) packet Take 17 g by mouth daily.   Yes [provider]  rOPINIRole (REQUIP) 0.5 MG tablet Take 0.5 mg by mouth 3 (three) times daily.    Yes [provider]  Vitamin D, Ergocalciferol, (DRISDOL) 1.25 MG (50000 UT) CAPS capsule Take 50,000 Units by mouth every 7 (seven) days.   Yes [provider]  neomycin-bacitracin-polymyxin (NEOSPORIN) ointment Apply 1 application topically every 12 (twelve) hours. To lacerations and skin tears x 4 days Patient not taking: Reported on 09/03/2018 11/24/17   Ward, Layla MawKristen N, DO    PHYSICAL EXAM   Temp:  [98.5 F (36.9 C)] 98.5 F (36.9 C) (02/01 2358) Pulse Rate:  [76-96] 82 (02/02 0430) Resp:  [15-23] 18 (02/02 0430) BP: (141-197)/(64-100) 142/73 (02/02 0430) SpO2:  [97 %-99 %] 98 % (02/02 0430) Weight:  [68 kg] 68 kg (02/02 0001)  BP (!) 142/73   Pulse 82   Temp 98.5  F (36.9 C) (Oral)   Resp 18   Ht 5\' 6"  (1.676 m)   Wt 68 kg   SpO2 98%   BMI 24.21 kg/m    GEN thin elderly caucasian female; yelling various names, able to focus attention for conversation for a few seconds but speech tangential, disorganized; answers to questions are inappropriate;  HEENT NCAT EOM intact PERRL; clear oropharynx, no cervical LAD; dry mucus membranes  JVP estimated 4 cm H2O above RA; no HJR ; no carotid bruits b/l ;  CV regular normal rate; normal S1 and S2; no m/r/g or S3/S4; no parasternal heave  RESP CTA b/l; breathing unlabored and symmetric  ABD soft NT ND +normoactive BS Foley in place; dark orange colored clear urine draining in bag EXT warm throughout b/l; no peripheral edema b/l  PULSES  DP and  radials 2+ intact  SKIN/MSK dressing on L anterior shin wound ulcer  NEURO/PSYCH AAOx1 (name only); no gross focal deficits (does not follow commands consistently). During exam, patient was yelling various names, able to focus attention for conversation for a few seconds but speech tangential, disorganized; answers to questions are inappropriate;   DATA   LABS ON ADMISSION:  Basic Metabolic Panel: Recent Labs  Lab 09/03/18 0043  NA 139  K 4.1  CL 110  CO2 22  GLUCOSE 227*  BUN 28*  CREATININE 0.72  CALCIUM 8.6*   CBC: Recent Labs  Lab 09/03/18 0043  WBC 10.7*  NEUTROABS 7.3  HGB 12.5  HCT 38.8  MCV 100.8*  PLT 205   Liver Function Tests: Recent Labs  Lab 09/03/18 0043  AST 39  ALT 38  ALKPHOS 55  BILITOT 0.6  PROT 6.6  ALBUMIN 2.9*   No results for input(s): LIPASE, AMYLASE in the last 168 hours. No results for input(s): AMMONIA in the last 168 hours. Coagulation:  No results found for: INR, PROTIME No results found for: PTT Lactic Acid, Venous:     Component Value Date/Time   LATICACIDVEN 1.4 09/03/2018 0225   Cardiac Enzymes: Recent Labs  Lab 09/03/18 0043  TROPONINI <0.03   Urinalysis:    Component Value Date/Time    COLORURINE YELLOW 09/03/2018 0059   APPEARANCEUR HAZY (A) 09/03/2018 0059   LABSPEC 1.033 (H) 09/03/2018 0059   PHURINE 5.0 09/03/2018 0059   GLUCOSEU 50 (A) 09/03/2018 0059   HGBUR SMALL (A) 09/03/2018 0059   BILIRUBINUR NEGATIVE 09/03/2018 0059   KETONESUR 20 (A) 09/03/2018 0059   PROTEINUR 100 (A) 09/03/2018 0059   NITRITE NEGATIVE 09/03/2018 0059   LEUKOCYTESUR MODERATE (A) 09/03/2018 0059    BNP (last 3 results) No results for input(s): PROBNP in the last 8760 hours. CBG: No results for input(s): GLUCAP in the last 168 hours.  Radiological Exams on Admission: No results found.  EKG: Independently reviewed. NSR 93 bpm, baseline artifact  I have reviewed the patient's previous electronic chart records, labs, and other data.   ASSESSMENT AND PLAN   Assessment: Maree Krabbethel Edgecombe is a 85 y.o. female with Parkinson's dementia, anemia, hypothyroidism, nursing [facility] and currently wheelchair bound [facility] with increasing delirium and agitation.  POA/decision maker (patient's nephew) has been particularly concerned that patient has seemingly given up since her sister's passing a few months ago. Suspect acute delirium on top of chronic decline with failure to thrive -- although with her age and cognitive status, it is unlikely that patient will make sustained improvements in her mentation. Patient is confused on exam, but unclear if this is new baseline; also appears dehydrated on admission. Will treat empirically for UTI given UA results and continue gentle fluid resuscitation. Will try to pare down current medication list given several psychotropic meds. She and her family would benefit from continued discussion regarding goals of care, although previous providers have noted that patient is currently full code and that POA was reportedly upset regarding recent palliative care discussions. Plan to tentatively revisit code status on this admission.     Active Problems:   Delirium   Plan:   # Delirium vs ongoing cognitive decline in setting of Parkinson's dementia and failure to thrive > UA + WBC, rare bacteria - urine culture pending - UDS added-on  - empiric ceftriaxone 1gm (D1 2/2) q24h  - TSH and ammonia levels pending - continue low dose depakote  250mg  BID (for mood stablization) - continue sinemet 1.5mg  TID - continue lexapro 10mg  daily - hold off namenda for now given unclear benefit - reduce ropinirole to 0.5mg  BID from TID given high dose and no report of restless legs or tremor - hold off on ativan given concern for delirium - reduce nighttime mirtazapine to 7.5mg  nightly (from 15mg  nightly) - haldol 2mg  IV q6h prn only for severe agitation - sitter/tele-sitter requested - maintenance IV fluids NS at 100 cc/hr (received 1.5L in ED) - PT/OT and SW consult - palliative care consult - continue bowel regimen  # Hx of HLD and HTN - stop anti-HTN and aspirin at this time given unclear benefit - can resume anti-HTN meds if persistently hypertensive, but would allow permissive HTN to sBP ~ 160s  # Hypothyroidism on synthroid - resume synthroid 88 mcg daily - check TSH   # Dry eyes - resume liquifilm tears  # Wound on L anterior lower shin - wound care by nursing     DVT Prophylaxis: lovenox  Code Status:  Full Code Family Communication: son contacted by ED overnight  Disposition Plan: admit to floor; dispo pending delirium workup, PT/OT eval and family goals of care discussion   Patient contact: Extended Emergency Contact Information Primary Emergency Contact: Zannie [place] Home Phone: [phone] Mobile Phone: [phone] Relation: Nephew Secondary Emergency Contact: Bean,Cindy Address: [address]          [place], [place] [address] [place] Home Phone: [phone] Work Phone: [phone] Mobile Phone: [phone] Relation: Niece  Time spent: > 35  mins  Ike Bene, MD [facility] Pager [phone]  If 7PM-7AM, please contact night-coverage [email or web address] Password [facility] 09/03/2018, 5:35 AM

## 2018-09-03 NOTE — ED Notes (Signed)
NephewWilly [phone]- Please call him with an update or any concerns.

## 2018-09-03 NOTE — ED Notes (Signed)
ED TO INPATIENT HANDOFF REPORT  Name/Age/Gender Brandy Martinez 85 y.o. female  Code Status    Code Status Orders  (From admission, onward)         Start     Ordered   09/03/18 0531  Full code  Continuous     09/03/18 0534        Code Status History    This patient has a current code status but no historical code status.      Home/SNF/Other Nursing Home  Chief Complaint Failure To Thrive  Level of Care/Admitting Diagnosis ED Disposition    ED Disposition Condition Comment   Admit  Hospital Area: [facility] [[id number]]  Level of Care: Med-Surg [16]  Diagnosis: Delirium [169678]  Admitting Physician: Ike Bene [[id number]]  Attending Physician: Ike Bene [[id number]]  Estimated length of stay: past midnight tomorrow  Certification:: I certify this patient will need inpatient services for at least 2 midnights  PT Class (Do Not Modify): Inpatient [101]  PT Acc Code (Do Not Modify): Private [1]       Medical History Past Medical History:  Diagnosis Date  . Anemia   . B12 deficiency   . Delirium   . Diabetes mellitus without complication (HCC)   . Elevated TSH 06/18/2016  . Generalized weakness   . Hyperlipidemia   . Hypertension   . Idiopathic peripheral neuropathy   . Inability to walk   . Mobility impaired   . Neurologic gait dysfunction   . Parkinson's disease (HCC)     Allergies Allergies  Allergen Reactions  . Codeine   . Lipitor [Atorvastatin Calcium]     IV Location/Drains/Wounds Patient Lines/Drains/Airways Status   Active Line/Drains/Airways    Name:   Placement date:   Placement time:   Site:   Days:   Peripheral IV 09/03/18 Right Hand   09/03/18    0039    Hand   less than 1   Urethral Catheter [facility] Double-lumen;Latex 18 Fr.   08/21/18    -    Double-lumen;Latex   13          Labs/Imaging Results for orders placed or performed during the hospital encounter of 09/02/18 (from the past 48  hour(s))  Comprehensive metabolic panel     Status: Abnormal   Collection Time: 09/03/18 12:43 AM  Result Value Ref Range   Sodium 139 135 - 145 mmol/L   Potassium 4.1 3.5 - 5.1 mmol/L    Comment: SLIGHT HEMOLYSIS   Chloride 110 98 - 111 mmol/L   CO2 22 22 - 32 mmol/L   Glucose, Bld 227 (H) 70 - 99 mg/dL   BUN 28 (H) 8 - 23 mg/dL   Creatinine, Ser 2.77 0.44 - 1.00 mg/dL   Calcium 8.6 (L) 8.9 - 10.3 mg/dL   Total Protein 6.6 6.5 - 8.1 g/dL   Albumin 2.9 (L) 3.5 - 5.0 g/dL   AST 39 15 - 41 U/L   ALT 38 0 - 44 U/L   Alkaline Phosphatase 55 38 - 126 U/L   Total Bilirubin 0.6 0.3 - 1.2 mg/dL   GFR calc non Af Amer >60 >60 mL/min   GFR calc Af Amer >60 >60 mL/min   Anion gap 7 5 - 15    Comment: Performed at [facility], [address], [place], [place] [address]  CBC with Differential     Status: Abnormal   Collection Time: 09/03/18 12:43 AM  Result  Value Ref Range   WBC 10.7 (H) 4.0 - 10.5 K/uL   RBC 3.85 (L) 3.87 - 5.11 MIL/uL   Hemoglobin 12.5 12.0 - 15.0 g/dL   HCT 16.138.8 09.636.0 - 04.546.0 %   MCV 100.8 (H) 80.0 - 100.0 fL   MCH 32.5 26.0 - 34.0 pg   MCHC 32.2 30.0 - 36.0 g/dL   RDW 40.912.1 81.111.5 - 91.415.5 %   Platelets 205 150 - 400 K/uL   nRBC 0.0 0.0 - 0.2 %   Neutrophils Relative % 68 %   Neutro Abs 7.3 1.7 - 7.7 K/uL   Lymphocytes Relative 22 %   Lymphs Abs 2.3 0.7 - 4.0 K/uL   Monocytes Relative 7 %   Monocytes Absolute 0.7 0.1 - 1.0 K/uL   Eosinophils Relative 2 %   Eosinophils Absolute 0.2 0.0 - 0.5 K/uL   Basophils Relative 0 %   Basophils Absolute 0.0 0.0 - 0.1 K/uL   Immature Granulocytes 1 %   Abs Immature Granulocytes 0.08 (H) 0.00 - 0.07 K/uL    Comment: Performed at [facility], [address] [place], [place] [address]  Troponin I - Once     Status: None   Collection Time: 09/03/18 12:43 AM  Result Value Ref Range   Troponin I <0.03 <0.03 ng/mL    Comment: Performed at [facility], [address]  [place], [place] [address]  Lactic acid, plasma     Status: None   Collection Time: 09/03/18 12:43 AM  Result Value Ref Range   Lactic Acid, Venous 1.7 0.5 - 1.9 mmol/L    Comment: Performed at [facility], [address] [place], [place] [address]  Valproic acid level     Status: Abnormal   Collection Time: 09/03/18 12:43 AM  Result Value Ref Range   Valproic Acid Lvl 30 (L) 50.0 - 100.0 ug/mL    Comment: Performed at [facility], [address] [place], [place] [address]  Urinalysis, Routine w reflex microscopic     Status: Abnormal   Collection Time: 09/03/18 12:59 AM  Result Value Ref Range   Color, Urine YELLOW YELLOW   APPearance HAZY (A) CLEAR   Specific Gravity, Urine 1.033 (H) 1.005 - 1.030   pH 5.0 5.0 - 8.0   Glucose, UA 50 (A) NEGATIVE mg/dL   Hgb urine dipstick SMALL (A) NEGATIVE   Bilirubin Urine NEGATIVE NEGATIVE   Ketones, ur 20 (A) NEGATIVE mg/dL   Protein, ur 952100 (A) NEGATIVE mg/dL   Nitrite NEGATIVE NEGATIVE   Leukocytes, UA MODERATE (A) NEGATIVE   RBC / HPF 21-50 0 - 5 RBC/hpf   WBC, UA >50 (H) 0 - 5 WBC/hpf   Bacteria, UA RARE (A) NONE SEEN   Squamous Epithelial / LPF 0-5 0 - 5   Mucus PRESENT     Comment: Performed at [facility], [address] [place], [place] [address]  Lactic acid, plasma     Status: None   Collection Time: 09/03/18  2:25 AM  Result Value Ref Range   Lactic Acid, Venous 1.4 0.5 - 1.9 mmol/L    Comment: Performed at [facility], [address] [place], [place] [address]   No results found. EKG Interpretation  Date/Time:  Sunday September 03 2018 00:24:12 EST Ventricular Rate:  93 PR Interval:    QRS Duration: 85 QT Interval:  358 QTC Calculation: 446 R Axis:   27 Text Interpretation:  Sinus  rhythm Low voltage, precordial leads Abnormal R-wave progression, early transition Artifact in lead(s) I III aVR aVL aVF V1 V2 No significant change since last  tracing 11 May 2017 Confirmed by Devoria Albe ([id number]) on 09/03/2018 1:58:29 AM   Pending Labs Unresulted Labs (From admission, onward)    Start     Ordered   09/04/18 0500  CBC  Daily,   R     09/03/18 0534   09/04/18 0500  Magnesium  Daily,   R     09/03/18 0534   09/04/18 0500  Basic metabolic panel  Daily,   R     09/03/18 0534   09/03/18 0550  Urine rapid drug screen (hosp performed)  Add-on,   R     09/03/18 0549   09/03/18 0532  TSH  Add-on,   R     09/03/18 0534   09/03/18 0532  Ammonia  Add-on,   R     09/03/18 0534   09/03/18 0202  Urine culture  ONCE - STAT,   STAT    Question:  Patient immune status  Answer:  Normal   09/03/18 0201          Vitals/Pain Today's Vitals   09/03/18 0300 09/03/18 0330 09/03/18 0400 09/03/18 0430  BP: (!) 158/70 (!) 158/66 (!) 141/64 (!) 142/73  Pulse: 81 76 76 82  Resp: (!) 21 (!) 23 (!) 22 18  Temp:      TempSrc:      SpO2: 98% 98% 98% 98%  Weight:      Height:      PainSc:        Isolation Precautions No active isolations  Medications Medications  cefTRIAXone (ROCEPHIN) 1 g in sodium chloride 0.9 % 100 mL IVPB (has no administration in time range)  carbidopa-levodopa (SINEMET IR) 25-100 MG per tablet immediate release 1.5 tablet (has no administration in time range)  divalproex (DEPAKOTE) DR tablet 250 mg (has no administration in time range)  docusate sodium (COLACE) capsule 100 mg (has no administration in time range)  escitalopram (LEXAPRO) tablet 10 mg (has no administration in time range)  polyvinyl alcohol (LIQUIFILM TEARS) 1.4 % ophthalmic solution 1 drop (has no administration in time range)  levothyroxine (SYNTHROID, LEVOTHROID) tablet 88 mcg (has no administration in time range)  enoxaparin (LOVENOX) injection 40 mg (has no administration in time range)  haloperidol lactate (HALDOL) injection 2 mg (has no administration in time range)  rOPINIRole (REQUIP) tablet 0.5 mg (has no administration in time range)   mirtazapine (REMERON) tablet 7.5 mg (has no administration in time range)  0.9 %  sodium chloride infusion (has no administration in time range)  sodium chloride 0.9 % bolus 1,000 mL (0 mLs Intravenous Stopped 09/03/18 0335)  sodium chloride 0.9 % bolus 500 mL (0 mLs Intravenous Stopped 09/03/18 0214)  cefTRIAXone (ROCEPHIN) 1 g in sodium chloride 0.9 % 100 mL IVPB (0 g Intravenous Stopped 09/03/18 0435)    Mobility non-ambulatory

## 2018-09-03 NOTE — Progress Notes (Signed)
Patient was repositioned every 2 -3 hours. Nephew, Richard Alstin who is HCPOA came in to see patient. He also spoke with Dr. Rhona Leavens.

## 2018-09-03 NOTE — Progress Notes (Signed)
Pt arrived from ED via strecher at 0650, pt with multiple skin ulcers/MASB, bruising bilateral arms and legs,see skin assessment for details. Pt alert and oriented to self only. Oriented to unit and room. Pt with bowel movement and smell of urine. Foley in place, per Jonny Ruiz in ED foley was placed from a prior nurse, however foley appears to be from outside facility. Oral care provided. Complete bath completed, heels floated on pillow. Unable to complete admission history due to dementia/FTT, bed alarm activated, call bell w/in reach, instructed pt how to use. Pt will need reinforcement.

## 2018-09-03 NOTE — Progress Notes (Signed)
PROGRESS NOTE    Brandy Martinez  VZC:[id number] DOB: 1932/06/26 DOA: 09/02/2018 PCP: System, Pcp Not In    Brief Narrative:  85 y.o. female with Parkinson's dementia, anemia, hypothyroidism, longterm resident at [facility], currently wheelchair, bound who presented from [place] at the behest of her nephew ([place]) who was concerned that patient has become more altered and agitated from baseline. Has reported that patient is not making sense in conversation and speaking about deceased family members. Also reported that patient has been refusing to eat her meals. Patient tells me that she feels that she has to urinate and feels poorly, but otherwise is unable to provide more detail. Per ED provider, the nephew is concerned that patient is "giving up" after her sister's passing this past December. According to rehab facility notes, patient completed a 5 day course of levofloxacin for presumed UTI. She appears to have a foley catheter placed sometime in the past 1-2 weeks.  Of note, patient's POA (nephew) was reportedly concerned about recent palliative care and code status discussions -- patient currently remains full code. Palliative care note from [facility] on 08/25/18 noted that patient has severe cognitive deficits and weakness thought related to progression of Parkinson's.   Assessment & Plan:   Active Problems:   Delirium   Failure to thrive in adult  # Delirium vs ongoing cognitive decline in setting of Parkinson's dementia and failure to thrive > UA + WBC, rare bacteria - urine culture pending - empiric ceftriaxone 1gm (D1 2/2) q24h given presenting delirium - TSH markedly elevated. Question compliance with levothyroxine. In hospital, pt observed "pocketing" medications - Ammonia normal - continue low dose depakote 250mg  BID (for mood stablization) - continue sinemet 1.5mg  TID - continue lexapro 10mg  daily - namenda on hold at time of presentation - reduced  ropinirole to 0.5mg  BID from TID given high dose and no report of restless legs or tremor - held off on ativan given concern for delirium - reduced mirtazapine to 7.5mg  nightly (from 15mg  nightly) - Continued on haldol 2mg  IV q6h prn only for severe agitation - Continued on maintenance IV fluids NS at 100 cc/hr (received 1.5L in ED) - PT/OT and SW consulted - Consulted Palliative Care. Would appreciate recs for goals of care  # Hx of HLD   # HTN - Resume lisinopril - BP elevated today - renal function stable  # Hypothyroidism on synthroid - per above, TSH markedly elevated -Pt observed to be pocketing levothyroxine this AM. Question compliance with medication prior to admit -Will increase levothyroxine to -Recommend repeat TSH in 4-6 weeks   # Dry eyes - resume liquifilm tears  # Wound on L anterior lower shin - wound care requested  DVT prophylaxis: Lovenox subQ Code Status: Full Family Communication: Pt in room Disposition Plan: Uncertain at this time  Consultants:   Palliative Care  Procedures:     Antimicrobials: Anti-infectives (From admission, onward)   Start     Dose/Rate Route Frequency Ordered Stop   09/03/18 2200  cefTRIAXone (ROCEPHIN) 1 g in sodium chloride 0.9 % 100 mL IVPB     1 g 200 mL/hr over 30 Minutes Intravenous Daily at bedtime 09/03/18 0534     09/03/18 0330  cefTRIAXone (ROCEPHIN) 1 g in sodium chloride 0.9 % 100 mL IVPB     1 g 200 mL/hr over 30 Minutes Intravenous  Once 09/03/18 0321 09/03/18 0435       Subjective: Confused this AM. Tearful  about her husband who she claims is at work as a Music therapistcarpenter today  Objective: Vitals:   09/03/18 0530 09/03/18 0600 09/03/18 0841 09/03/18 1448  BP: (!) 175/108 (!) 155/86 (!) 164/74 140/65  Pulse: 97 93 84 82  Resp: 18 15 18 16   Temp:   98.8 F (37.1 C) 99.1 F (37.3 C)  TempSrc:   Oral Oral  SpO2: 97% 96% 100% 97%  Weight:      Height:        Intake/Output Summary (Last  24 hours) at 09/03/2018 1604 Last data filed at 09/03/2018 0930 Gross per 24 hour  Intake 500 ml  Output 650 ml  Net -150 ml   Filed Weights   09/03/18 0001  Weight: 68 kg    Examination:  General exam: Appears calm and comfortable  Respiratory system: Clear to auscultation. Respiratory effort normal. Cardiovascular system: S1 & S2 heard, RRR Gastrointestinal system: Abdomen is nondistended, soft and nontender. No organomegaly or masses felt. Normal bowel sounds heard. Central nervous system: Alert. No focal neurological deficits. Extremities: Symmetric 5 x 5 power. Skin: No rashes, lesions Psychiatry: Unable to assess, pt is confused, tearful  Data Reviewed: I have personally reviewed following labs and imaging studies  CBC: Recent Labs  Lab 09/03/18 0043  WBC 10.7*  NEUTROABS 7.3  HGB 12.5  HCT 38.8  MCV 100.8*  PLT 205   Basic Metabolic Panel: Recent Labs  Lab 09/03/18 0043  NA 139  K 4.1  CL 110  CO2 22  GLUCOSE 227*  BUN 28*  CREATININE 0.72  CALCIUM 8.6*   GFR: Estimated Creatinine Clearance: 49 mL/min (by C-G formula based on SCr of 0.72 mg/dL). Liver Function Tests: Recent Labs  Lab 09/03/18 0043  AST 39  ALT 38  ALKPHOS 55  BILITOT 0.6  PROT 6.6  ALBUMIN 2.9*   No results for input(s): LIPASE, AMYLASE in the last 168 hours. Recent Labs  Lab 09/03/18 0815  AMMONIA 21   Coagulation Profile: No results for input(s): INR, PROTIME in the last 168 hours. Cardiac Enzymes: Recent Labs  Lab 09/03/18 0043  TROPONINI <0.03   BNP (last 3 results) No results for input(s): PROBNP in the last 8760 hours. HbA1C: No results for input(s): HGBA1C in the last 72 hours. CBG: No results for input(s): GLUCAP in the last 168 hours. Lipid Profile: No results for input(s): CHOL, HDL, LDLCALC, TRIG, CHOLHDL, LDLDIRECT in the last 72 hours. Thyroid Function Tests: Recent Labs    09/03/18 0532  TSH 15.994*   Anemia Panel: No results for input(s):  VITAMINB12, FOLATE, FERRITIN, TIBC, IRON, RETICCTPCT in the last 72 hours. Sepsis Labs: Recent Labs  Lab 09/03/18 0043 09/03/18 0225  LATICACIDVEN 1.7 1.4    No results found for this or any previous visit (from the past 240 hour(s)).   Radiology Studies: No results found.  Scheduled Meds: . carbidopa-levodopa  1.5 tablet Oral TID  . divalproex  250 mg Oral BID  . docusate sodium  100 mg Oral BID  . enoxaparin (LOVENOX) injection  40 mg Subcutaneous Q24H  . escitalopram  10 mg Oral Daily  . levothyroxine  100 mcg Oral Q0600  . mirtazapine  7.5 mg Oral QHS  . polyvinyl alcohol  1 drop Both Eyes Daily  . rOPINIRole  0.5 mg Oral BID   Continuous Infusions: . sodium chloride 1,000 mL (09/03/18 1545)  . cefTRIAXone (ROCEPHIN)  IV       LOS: 0 days   Rickey BarbaraStephen , MD  [facility] Pager On Amion  If 7PM-7AM, please contact night-coverage 09/03/2018, 4:04 PM

## 2018-09-03 NOTE — ED Provider Notes (Signed)
[facility]-EMERGENCY DEPT Provider Note   CSN: [id number] Arrival date & time: 09/02/18  2349  Time seen 12:20 AM   History   Chief Complaint Chief Complaint  Patient presents with  . Failure To Thrive   Level 5 caveat for age  HPI Brandy Martinez is a 85 y.o. female.  HPI patient states "do not feel good" and states she has no energy.  Patient was unable to give me anything else specific.  Her nephew states she has not been eating and the nursing staff reports she has been crying out "help me".  He states she is to have started having trouble walking the past few months and now she is wheelchair-bound.  Patient has a Foley catheter that he was unaware of and states it was not there 12 days ago.  He states she has been talking about her brother as if he is in the room and he has been dead 20 years.  He states her sister, his mother died in December and they have noticed she is starting to decline since that happened.  She recently had a UTI and was on Levaquin from January 95th January 15.  He is upset because he states he was called today and was asked about her CODE STATUS and the fact that she might need to be in palliative care.  He did not understand why all of a sudden her condition had changed.  He states they feel like she has given up since her sister died.  PCP Dr Kara PacerKhaghara Blake   Past Medical History:  Diagnosis Date  . Anemia   . B12 deficiency   . Delirium   . Diabetes mellitus without complication (HCC)   . Elevated TSH 06/18/2016  . Generalized weakness   . Hyperlipidemia   . Hypertension   . Idiopathic peripheral neuropathy   . Inability to walk   . Mobility impaired   . Neurologic gait dysfunction   . Parkinson's disease [facility](HCC)     Patient Active Problem List   Diagnosis Date Noted  . Palliative care encounter 08/28/2018  . Senile osteoporosis 11/03/2016  . Type 2 diabetes mellitus without complication, without long-term current use of  insulin (HCC) 11/03/2016  . Cognitive impairment 11/03/2016  . Parkinson's disease (HCC) 04/29/2016  . Osteoporosis 04/29/2016  . B12 deficiency 04/29/2016  . Major depression, chronic 04/29/2016  . Hyperlipidemia LDL goal <70 04/29/2016  . Essential hypertension, benign 04/29/2016  . Acquired hypothyroidism 04/29/2016  . Diabetes mellitus type 2 in nonobese (HCC) 04/29/2016  . RLS (restless legs syndrome) 04/29/2016    Past Surgical History:  Procedure Laterality Date  . APPENDECTOMY       OB History   No obstetric history on file.      Home Medications    Prior to Admission medications   Medication Sig Start Date End Date Taking? Authorizing Provider  acetaminophen (TYLENOL) 325 MG tablet Take 650 mg by mouth every 4 (four) hours as needed for mild pain or moderate pain.   Yes [provider]  alendronate (FOSAMAX) 70 MG tablet Take 70 mg by mouth every Thursday. Take with a full glass of water on an empty stomach.  Take on Thursdays.   Yes [provider]  aspirin EC 81 MG tablet Take 81 mg by mouth daily.   Yes [provider]  carbidopa-levodopa (SINEMET IR) 25-100 MG tablet Take 1.5 tablets by mouth 3 (three) times daily.    Yes [provider]  divalproex (DEPAKOTE) 250 MG DR tablet Take 250 mg by mouth 2 (two) times daily.   Yes [provider]  docusate sodium (COLACE) 100 MG capsule Take 100 mg by mouth 2 (two) times daily.   Yes [provider]  escitalopram (LEXAPRO) 10 MG tablet Take 10 mg by mouth daily.   Yes [provider]  hydroxypropyl methylcellulose / hypromellose (ISOPTO TEARS / GONIOVISC) 2.5 % ophthalmic solution Place 1 drop into both eyes daily.   Yes [provider]  lactulose (CHRONULAC) 10 GM/15ML solution Take 20 g by mouth daily.   Yes [provider]  levofloxacin (LEVAQUIN) 750 MG tablet Take 750 mg by mouth 2 (two) times daily.   Yes [provider]    levothyroxine (SYNTHROID, LEVOTHROID) 88 MCG tablet Take 88 mcg by mouth daily before breakfast.   Yes [provider]  lisinopril (PRINIVIL,ZESTRIL) 2.5 MG tablet Take 2.5 mg by mouth daily.  08/21/17  Yes [provider]  LORazepam (ATIVAN) 0.5 MG tablet Take 0.5 mg by mouth daily as needed for anxiety.   Yes [provider]  Melatonin 3 MG TABS Take 5 mg by mouth at bedtime.    Yes [provider]  memantine (NAMENDA) 10 MG tablet Take 10 mg by mouth 2 (two) times daily.   Yes [provider]  mirtazapine (REMERON) 15 MG tablet Take 15 mg by mouth at bedtime.   Yes [provider]  polyethylene glycol (MIRALAX / GLYCOLAX) packet Take 17 g by mouth daily.   Yes [provider]  rOPINIRole (REQUIP) 0.5 MG tablet Take 0.5 mg by mouth 3 (three) times daily.    Yes [provider]  Vitamin D, Ergocalciferol, (DRISDOL) 1.25 MG (50000 UT) CAPS capsule Take 50,000 Units by mouth every 7 (seven) days.   Yes [provider]  neomycin-bacitracin-polymyxin (NEOSPORIN) ointment Apply 1 application topically every 12 (twelve) hours. To lacerations and skin tears x 4 days Patient not taking: Reported on 09/03/2018 11/24/17   Ward, Layla Maw, DO    Family History Family History  Problem Relation Age of Onset  . Hypertension Mother   . Hypertension Father   . Heart disease Father   . Heart disease Sister   . Diabetes Sister   . Cancer Sister   . Cancer Brother     Social History Social History   Tobacco Use  . Smoking status: Former Smoker    Types: Cigarettes  . Smokeless tobacco: Never Used  Substance Use Topics  . Alcohol use: No  . Drug use: No  Lives in a nursing home   Allergies   Codeine and Lipitor [atorvastatin calcium]   Review of Systems Review of Systems  Unable to perform ROS: Age     Physical Exam Updated Vital Signs BP (!) 171/88 (BP Location: Right Arm)   Pulse 96   Temp 98.5 F (36.9  C) (Oral)   Resp 17   Ht 5\' 6"  (1.676 m)   Wt 68 kg   SpO2 97%   BMI 24.21 kg/m   Vital signs normal except for hypertension   Physical Exam Vitals signs and nursing note reviewed.  Constitutional:      Appearance: She is normal weight.  HENT:     Head: Normocephalic and atraumatic.     Right Ear: External ear normal.     Left Ear: External ear normal.     Nose: Nose normal.     Mouth/Throat:     Mouth:  Mucous membranes are dry.     Comments: Patient's tongue and lips are very dry and caked Eyes:     Extraocular Movements: Extraocular movements intact.     Conjunctiva/sclera: Conjunctivae normal.     Pupils: Pupils are equal, round, and reactive to light.  Cardiovascular:     Rate and Rhythm: Normal rate.  Pulmonary:     Effort: Pulmonary effort is normal. No respiratory distress.  Abdominal:     General: Bowel sounds are normal. There is no distension.     Tenderness: There is no abdominal tenderness.  Musculoskeletal:        General: No deformity.  Skin:    General: Skin is warm and dry.     Findings: No erythema.  Neurological:     Mental Status: She is alert.     Comments: Patient is generalized weak  Psychiatric:        Mood and Affect: Affect is flat.        Speech: Speech is delayed.        Behavior: Behavior is slowed.      ED Treatments / Results  Labs (all labs ordered are listed, but only abnormal results are displayed) Results for orders placed or performed during the hospital encounter of 09/02/18  Comprehensive metabolic panel  Result Value Ref Range   Sodium 139 135 - 145 mmol/L   Potassium 4.1 3.5 - 5.1 mmol/L   Chloride 110 98 - 111 mmol/L   CO2 22 22 - 32 mmol/L   Glucose, Bld 227 (H) 70 - 99 mg/dL   BUN 28 (H) 8 - 23 mg/dL   Creatinine, Ser 1.11 0.44 - 1.00 mg/dL   Calcium 8.6 (L) 8.9 - 10.3 mg/dL   Total Protein 6.6 6.5 - 8.1 g/dL   Albumin 2.9 (L) 3.5 - 5.0 g/dL   AST 39 15 - 41 U/L   ALT 38 0 - 44 U/L   Alkaline Phosphatase 55  38 - 126 U/L   Total Bilirubin 0.6 0.3 - 1.2 mg/dL   GFR calc non Af Amer >60 >60 mL/min   GFR calc Af Amer >60 >60 mL/min   Anion gap 7 5 - 15  CBC with Differential  Result Value Ref Range   WBC 10.7 (H) 4.0 - 10.5 K/uL   RBC 3.85 (L) 3.87 - 5.11 MIL/uL   Hemoglobin 12.5 12.0 - 15.0 g/dL   HCT 55.2 08.0 - 22.3 %   MCV 100.8 (H) 80.0 - 100.0 fL   MCH 32.5 26.0 - 34.0 pg   MCHC 32.2 30.0 - 36.0 g/dL   RDW 36.1 22.4 - 49.7 %   Platelets 205 150 - 400 K/uL   nRBC 0.0 0.0 - 0.2 %   Neutrophils Relative % 68 %   Neutro Abs 7.3 1.7 - 7.7 K/uL   Lymphocytes Relative 22 %   Lymphs Abs 2.3 0.7 - 4.0 K/uL   Monocytes Relative 7 %   Monocytes Absolute 0.7 0.1 - 1.0 K/uL   Eosinophils Relative 2 %   Eosinophils Absolute 0.2 0.0 - 0.5 K/uL   Basophils Relative 0 %   Basophils Absolute 0.0 0.0 - 0.1 K/uL   Immature Granulocytes 1 %   Abs Immature Granulocytes 0.08 (H) 0.00 - 0.07 K/uL  Urinalysis, Routine w reflex microscopic  Result Value Ref Range   Color, Urine YELLOW YELLOW   APPearance HAZY (A) CLEAR   Specific Gravity, Urine 1.033 (H) 1.005 - 1.030   pH 5.0 5.0 -  8.0   Glucose, UA 50 (A) NEGATIVE mg/dL   Hgb urine dipstick SMALL (A) NEGATIVE   Bilirubin Urine NEGATIVE NEGATIVE   Ketones, ur 20 (A) NEGATIVE mg/dL   Protein, ur 161 (A) NEGATIVE mg/dL   Nitrite NEGATIVE NEGATIVE   Leukocytes, UA MODERATE (A) NEGATIVE   RBC / HPF 21-50 0 - 5 RBC/hpf   WBC, UA >50 (H) 0 - 5 WBC/hpf   Bacteria, UA RARE (A) NONE SEEN   Squamous Epithelial / LPF 0-5 0 - 5   Mucus PRESENT   Troponin I - Once  Result Value Ref Range   Troponin I <0.03 <0.03 ng/mL  Lactic acid, plasma  Result Value Ref Range   Lactic Acid, Venous 1.7 0.5 - 1.9 mmol/L  Lactic acid, plasma  Result Value Ref Range   Lactic Acid, Venous 1.4 0.5 - 1.9 mmol/L  Valproic acid level  Result Value Ref Range   Valproic Acid Lvl 30 (L) 50.0 - 100.0 ug/mL   Laboratory interpretation all normal except mild leukocytosis,  possible UTI, urine culture sent, urine is concentrated and her BUN is elevated consistent with dehydration.  Her valproic acid is subtherapeutic.    EKG EKG Interpretation  Date/Time:  Sunday September 03 2018 00:24:12 EST Ventricular Rate:  93 PR Interval:    QRS Duration: 85 QT Interval:  358 QTC Calculation: 446 R Axis:   27 Text Interpretation:  Sinus rhythm Low voltage, precordial leads Abnormal R-wave progression, early transition Artifact in lead(s) I III aVR aVL aVF V1 V2 No significant change since last tracing 11 May 2017 Confirmed by Devoria Albe ([id number]) on 09/03/2018 1:58:29 AM   Radiology No results found.  Procedures Procedures (including critical care time)  Medications Ordered in ED Medications  sodium chloride 0.9 % bolus 1,000 mL (0 mLs Intravenous Stopped 09/03/18 0335)  sodium chloride 0.9 % bolus 500 mL (0 mLs Intravenous Stopped 09/03/18 0214)  cefTRIAXone (ROCEPHIN) 1 g in sodium chloride 0.9 % 100 mL IVPB (0 g Intravenous Stopped 09/03/18 0435)     Initial Impression / Assessment and Plan / ED Course  I have reviewed the triage vital signs and the nursing notes.  Pertinent labs & imaging results that were available during my care of the patient were reviewed by me and considered in my medical decision making (see chart for details).     Patient was given IV fluids for her dehydration and laboratory testing was done.  I rechecked patient around 4 AM, she still very slow to respond.  Her nephew was no longer in the room.  After reviewing her laboratory test results she was started on Rocephin after getting a urine culture sent.  5:03 AM Dr. Arville Care, hospitalist will admit  Final Clinical Impressions(s) / ED Diagnoses   Final diagnoses:  Dehydration  Somnolence  Urinary tract infection with hematuria, site unspecified    Plan admission  Devoria Albe, MD, Concha Pyo, MD 09/03/18 [phone]

## 2018-09-03 NOTE — Progress Notes (Signed)
Report rec'd telephonically from John in the ED. States client has no advance directives on chart, no zero gold ticket on file, pt. Arrived via EMS from [facility]. Client is confused.

## 2018-09-03 NOTE — ED Notes (Signed)
Bed: JI31 Expected date:  Expected time:  Means of arrival:  Comments: 1-8

## 2018-09-03 NOTE — Evaluation (Signed)
Physical Therapy Evaluation Patient Details Name: Brandy Martinez MRN: [id number] DOB: 07/17/1932 Today's Date: 09/03/2018   History of Present Illness  Pt admit with increaesd confusion and dementia iwth increased inability to eat. Pt with parkinson's and demntia at [place], recetnly her sister passed away and she has had a progresivbe decline per the nephew and chart. Nephew states before a month ago she would get up and walk with assistance and trasnfer to a chair at [facility]. However recently they stated she has not been eating and has had multiple falls trying to get out of bed or wc.   Clinical Impression  Pt with dementia however responding when I entered a little to cues verbal and tactile. Worked with pt at the [facility] of bed to see if increased arousal. Worked on sitting for about 10 minutes with constant cues to task and Min-ModA for balance. However eyes were closed most of the session during sitting and was thirsty yet did not respond to the straw or cup or attempt to use B UEs at edge of bed to reach for a cup or anything. She did occasional respond when the water cup was at her lips and had to be stimulated to swallow the sip of water.  Will trial pt and contiue to see pt in acute care to see if she becomes more alert and able to participate in mobility.     Follow Up Recommendations SNF    Equipment Recommendations       Recommendations for Other Services       Precautions / Restrictions Precautions Precautions: Fall      Mobility  Bed Mobility Overal bed mobility: Needs Assistance Bed Mobility: Supine to Sit;Sit to Supine     Supine to sit: Mod assist Sit to supine: Max assist;+2 for physical assistance   General bed mobility comments: pt assisted some with supine to sit, but unable to with sit to supine, was very fearful when lying back down   Transfers Overall transfer level: (unable, worked on sitting balance EOB for about 10 minutes with Mon-ModA at times )                   Ambulation/Gait                Careers information officer    Modified Rankin (Stroke Patients Only)       Balance Overall balance assessment: Needs assistance Sitting-balance support: Bilateral upper extremity supported Sitting balance-Leahy Scale: Fair   Postural control: Posterior lean;Left lateral lean                                   Pertinent Vitals/Pain Pain Assessment: Faces Faces Pain Scale: No hurt    Home Living Family/patient expects to be discharged to:: Skilled nursing facility                      Prior Function Level of Independence: Needs assistance   Gait / Transfers Assistance Needed: had assist from facility , was able to walk about a month ago and still able to trasnfer with assistance per nephew.            Hand Dominance        Extremity/Trunk Assessment        Lower Extremity Assessment Lower Extremity Assessment: Generalized weakness  Communication      Cognition Arousal/Alertness: Lethargic   Overall Cognitive Status: Impaired/Different from baseline(per nephew) Area of Impairment: Orientation;Attention;Awareness;Following commands;Memory;Problem solving;Safety/judgement                                      General Comments      Exercises     Assessment/Plan    PT Assessment Patient needs continued PT services  PT Problem List Decreased strength;Decreased activity tolerance;Decreased mobility       PT Treatment Interventions Functional mobility training;Therapeutic activities;Patient/family education    PT Goals (Current goals can be found in the Care Plan section)  Acute Rehab PT Goals PT Goal Formulation: Patient unable to participate in goal setting Time For Goal Achievement: 09/17/18 Potential to Achieve Goals: Fair    Frequency Min 2X/week   Barriers to discharge        Co-evaluation               AM-PAC  PT "6 Clicks" Mobility  Outcome Measure Help needed turning from your back to your side while in a flat bed without using bedrails?: Total Help needed moving from lying on your back to sitting on the side of a flat bed without using bedrails?: Total Help needed moving to and from a bed to a chair (including a wheelchair)?: Total Help needed standing up from a chair using your arms (e.g., wheelchair or bedside chair)?: Total Help needed to walk in hospital room?: Total Help needed climbing 3-5 steps with a railing? : Total 6 Click Score: 6    End of Session   Activity Tolerance: Patient limited by lethargy Patient left: with bed alarm set;in bed;with family/visitor present Nurse Communication: Mobility status;Need for lift equipment PT Visit Diagnosis: Muscle weakness (generalized) (M62.81)    Time: 8850-2774 PT Time Calculation (min) (ACUTE ONLY): 44 min   Charges:   PT Evaluation $PT Eval Low Complexity: 1 Low PT Treatments $Therapeutic Activity: 23-37 mins        Marella Bile, PT Acute Rehabilitation Services Pager: [phone] Office: [phone] 09/03/2018   Marella Bile 09/03/2018, 6:52 PM

## 2018-09-04 DIAGNOSIS — Z515 Encounter for palliative care: Secondary | ICD-10-CM

## 2018-09-04 DIAGNOSIS — Z7189 Other specified counseling: Secondary | ICD-10-CM

## 2018-09-04 LAB — RAPID URINE DRUG SCREEN, HOSP PERFORMED
Amphetamines: NOT DETECTED
BARBITURATES: NOT DETECTED
Benzodiazepines: NOT DETECTED
Cocaine: NOT DETECTED
Opiates: POSITIVE — AB
TETRAHYDROCANNABINOL: NOT DETECTED

## 2018-09-04 LAB — MAGNESIUM: Magnesium: 1.9 mg/dL (ref 1.7–2.4)

## 2018-09-04 LAB — BASIC METABOLIC PANEL
Anion gap: 6 (ref 5–15)
BUN: 23 mg/dL (ref 8–23)
CO2: 22 mmol/L (ref 22–32)
Calcium: 7.9 mg/dL — ABNORMAL LOW (ref 8.9–10.3)
Chloride: 115 mmol/L — ABNORMAL HIGH (ref 98–111)
Creatinine, Ser: 0.74 mg/dL (ref 0.44–1.00)
GFR calc Af Amer: >60 mL/min (ref >60)
GFR calc non Af Amer: >60 mL/min (ref >60)
Glucose, Bld: 146 mg/dL — ABNORMAL HIGH (ref 70–99)
Potassium: 3.6 mmol/L (ref 3.5–5.1)
Sodium: 143 mmol/L (ref 135–145)

## 2018-09-04 LAB — CBC
HCT: 35.7 % — ABNORMAL LOW (ref 36.0–46.0)
Hemoglobin: 11 g/dL — ABNORMAL LOW (ref 12.0–15.0)
MCH: 31.7 pg (ref 26.0–34.0)
MCHC: 30.8 g/dL (ref 30.0–36.0)
MCV: 102.9 fL — ABNORMAL HIGH (ref 80.0–100.0)
Platelets: 177 10*3/uL (ref 150–400)
RBC: 3.47 MIL/uL — ABNORMAL LOW (ref 3.87–5.11)
RDW: 12 % (ref 11.5–15.5)
WBC: 9.4 10*3/uL (ref 4.0–10.5)
nRBC: 0 % (ref 0.0–0.2)

## 2018-09-04 MED ORDER — SODIUM CHLORIDE 0.9 % IV SOLN
INTRAVENOUS | Status: DC
  Administered 2018-09-05 – 2018-09-06 (×3): via INTRAVENOUS

## 2018-09-04 NOTE — Progress Notes (Signed)
Need an updated order for tele sitter monitoring, notified MD on call via page/text. Awaiting call back.

## 2018-09-04 NOTE — Progress Notes (Signed)
PROGRESS NOTE    Brandy Martinez  OQH:[id number] DOB: [birth year] DOA: 09/02/2018 PCP: System, Pcp Not In    Brief Narrative:  85 y.o. female with Parkinson's dementia, anemia, hypothyroidism, longterm resident at [facility], currently wheelchair, bound who presented from [place] at the behest of her nephew ([place]) who was concerned that patient has become more altered and agitated from baseline. Has reported that patient is not making sense in conversation and speaking about deceased family members. Also reported that patient has been refusing to eat her meals. Patient tells me that she feels that she has to urinate and feels poorly, but otherwise is unable to provide more detail. Per ED provider, the nephew is concerned that patient is "giving up" after her sister's passing this past December. According to rehab facility notes, patient completed a 5 day course of levofloxacin for presumed UTI. She appears to have a foley catheter placed sometime in the past 1-2 weeks.  Of note, patient's POA (nephew) was reportedly concerned about recent palliative care and code status discussions -- patient currently remains full code. Palliative care note from [facility] on 08/25/18 noted that patient has severe cognitive deficits and weakness thought related to progression of Parkinson's.   Assessment & Plan:   Active Problems:   Delirium   Failure to thrive in adult   Pressure injury of skin  # Delirium vs ongoing cognitive decline in setting of Parkinson's dementia and failure to thrive > UA + WBC, rare bacteria - urine culture pending - continue empiric ceftriaxone 1gm (D1 2/2) q24h given presenting delirium - TSH markedly elevated. Question compliance with levothyroxine. Earlier in hospital course, pt observed "pocketing" medications - Ammonia normal - continue low dose depakote 250mg  BID (for mood stablization) - continue sinemet 1.5mg  TID - continue lexapro 10mg  daily -  namenda placed on hold at time of presentation - reduced ropinirole to 0.5mg  BID from TID given high dose and no report of restless legs or tremor - held off on ativan given concern for delirium - reduced mirtazapine to 7.5mg  nightly (from 15mg  nightly) - Continued on haldol 2mg  IV q6h prn only for severe agitation - Continued on maintenance IV fluids NS at 100 cc/hr (received 1.5L in ED) - PT/OT and SW consulted. Recommendation for SNF - Consulted Palliative Care. Would appreciate recs for goals of care. Updated patient's POA 2/2 who is agreeable to discuss with Palliative Care  # Hx of HLD   - Stable at present  # HTN - Resumed lisinopril - BP improved - renal function stable, labs reviewed  # Hypothyroidism on synthroid - per above, TSH markedly elevated at time of presentation -Pt observed to be pocketing levothyroxine this AM. Question compliance with medication prior to admit -continue on increased levothyroxine dose of -Recommend repeat TSH in 4-6 weeks   # Dry eyes - resume liquifilm tears  # Wound on L anterior lower shin - wound care requested  DVT prophylaxis: Lovenox subQ Code Status: Full Family Communication: Pt in room Disposition Plan: Uncertain at this time  Consultants:   Palliative Care  Procedures:     Antimicrobials: Anti-infectives (From admission, onward)   Start     Dose/Rate Route Frequency Ordered Stop   09/03/18 2200  cefTRIAXone (ROCEPHIN) 1 g in sodium chloride 0.9 % 100 mL IVPB     1 g 200 mL/hr over 30 Minutes Intravenous Daily at bedtime 09/03/18 0534     09/03/18 0330  cefTRIAXone (ROCEPHIN) 1 g  in sodium chloride 0.9 % 100 mL IVPB     1 g 200 mL/hr over 30 Minutes Intravenous  Once 09/03/18 0321 09/03/18 0435      Subjective: Remains confused  Objective: Vitals:   09/03/18 1448 09/03/18 1716 09/03/18 2207 09/04/18 0616  BP: 140/65 139/61 (!) 153/62 (!) 135/56  Pulse: 82 81 82 61  Resp: 16  16 20   Temp: 99.1  F (37.3 C)  99 F (37.2 C) 97.9 F (36.6 C)  TempSrc: Oral  Oral Oral  SpO2: 97% 99% 98% 93%  Weight:      Height:        Intake/Output Summary (Last 24 hours) at 09/04/2018 1610 Last data filed at 09/04/2018 0845 Gross per 24 hour  Intake 584.39 ml  Output 1000 ml  Net -415.61 ml   Filed Weights   09/03/18 0001  Weight: 68 kg    Examination: General exam: Awake, laying in bed, in nad Respiratory system: Normal respiratory effort, no wheezing Cardiovascular system: regular rate, s1, s2 Gastrointestinal system: Soft, nondistended, positive BS Central nervous system: CN2-12 grossly intact, strength intact Extremities: Perfused, no clubbing Skin: Normal skin turgor, no notable skin lesions seen Psychiatry: Difficult to assess given patient's mental status  Data Reviewed: I have personally reviewed following labs and imaging studies  CBC: Recent Labs  Lab 09/03/18 0043 09/04/18 0502  WBC 10.7* 9.4  NEUTROABS 7.3  --   HGB 12.5 11.0*  HCT 38.8 35.7*  MCV 100.8* 102.9*  PLT 205 177   Basic Metabolic Panel: Recent Labs  Lab 09/03/18 0043 09/04/18 0502  NA 139 143  K 4.1 3.6  CL 110 115*  CO2 22 22  GLUCOSE 227* 146*  BUN 28* 23  CREATININE 0.72 0.74  CALCIUM 8.6* 7.9*  MG  --  1.9   GFR: Estimated Creatinine Clearance: 49 mL/min (by C-G formula based on SCr of 0.74 mg/dL). Liver Function Tests: Recent Labs  Lab 09/03/18 0043  AST 39  ALT 38  ALKPHOS 55  BILITOT 0.6  PROT 6.6  ALBUMIN 2.9*   No results for input(s): LIPASE, AMYLASE in the last 168 hours. Recent Labs  Lab 09/03/18 0815  AMMONIA 21   Coagulation Profile: No results for input(s): INR, PROTIME in the last 168 hours. Cardiac Enzymes: Recent Labs  Lab 09/03/18 0043  TROPONINI <0.03   BNP (last 3 results) No results for input(s): PROBNP in the last 8760 hours. HbA1C: No results for input(s): HGBA1C in the last 72 hours. CBG: No results for input(s): GLUCAP in the last 168  hours. Lipid Profile: No results for input(s): CHOL, HDL, LDLCALC, TRIG, CHOLHDL, LDLDIRECT in the last 72 hours. Thyroid Function Tests: Recent Labs    09/03/18 0532 09/03/18 0815  TSH 15.994*  --   FREET4  --  0.83   Anemia Panel: No results for input(s): VITAMINB12, FOLATE, FERRITIN, TIBC, IRON, RETICCTPCT in the last 72 hours. Sepsis Labs: Recent Labs  Lab 09/03/18 0043 09/03/18 0225  LATICACIDVEN 1.7 1.4    No results found for this or any previous visit (from the past 240 hour(s)).   Radiology Studies: No results found.  Scheduled Meds: . carbidopa-levodopa  1.5 tablet Oral TID  . divalproex  250 mg Oral BID  . docusate sodium  100 mg Oral BID  . enoxaparin (LOVENOX) injection  40 mg Subcutaneous Q24H  . escitalopram  10 mg Oral Daily  . levothyroxine  100 mcg Oral Q0600  . lisinopril  2.5 mg Oral  Daily  . mirtazapine  7.5 mg Oral QHS  . polyvinyl alcohol  1 drop Both Eyes Daily  . rOPINIRole  0.5 mg Oral BID   Continuous Infusions: . cefTRIAXone (ROCEPHIN)  IV 1 g (09/03/18 2157)     LOS: 1 day   Rickey BarbaraStephen Chiu, MD [facility] Pager On Amion  If 7PM-7AM, please contact night-coverage 09/04/2018, 8:53 AM

## 2018-09-04 NOTE — Consult Note (Signed)
Consultation Note Date: 09/04/2018   Patient Name: Brandy Martinez  DOB: November 20, 1931  MRN: [id number]  Age / Sex: 85 y.o., female  PCP: System, Pcp Not In Referring Physician: Jerald Kief, MD  Reason for Consultation: Establishing goals of care and Psychosocial/spiritual support  HPI/Patient Profile: 85 y.o. female  admitted on 09/02/2018 with this medical history of  Parkinson's disease,  dementia, anemia, hypothyroidism, longterm resident at [facility], currently wheelchair, bound who presented from [place] at the behest of her nephew ([place]) who was concerned that patient has become more altered and agitated from baseline.  Family reports decreased oral intake,' and significant physical, functional and cognitive decline since the death of her sister in 2018/07/24 According to rehab facility notes, patient completed a 5 day course of levofloxacin for presumed UTI. She appears to have a foley catheter placed sometime in the past 1-2 weeks.  She has been seen by community-based palliative care and notes reflect that the patient has severe cognitive deficits and physical weakness.   Family face treatment option decisions, advanced directive decisions and anticipatory care needs.  Clinical Assessment and Goals of Care:   This NP Lorinda Creed reviewed medical records, received report from team, assessed the patient and then meet at the patient's bedside along with her nephew by phone  to discuss diagnosis, prognosis, GOC, EOL wishes disposition and options.  Concept of Hospice and Palliative Care were discussed  A detailed discussion was had today regarding advanced directives.  Concepts specific to code status, artifical feeding and hydration, continued IV antibiotics and rehospitalization was had.  The difference between a aggressive medical intervention path  and a palliative comfort care path  for this patient at this time was had.  Values and goals of care important to patient and family were attempted to be elicited.  Questions and concerns addressed.   Family encouraged to call with questions or concerns.   PMT will continue to support holistically.  Nephew/Richard Eliberto Ivory stated healthcare power of attorney request to have conference call occluding his sister before any decisions regarding treatment plan.  Provided information for conference call number and await callback from Mr. Austin to set a time to have this meeting.     SUMMARY OF RECOMMENDATIONS    Code Status/Advance Care Planning:  Full code-encouraged Mr. Austin to consider DNR/DNI status knowing poor outcomes in similar patients.   Palliative Prophylaxis:   Aspiration, Bowel Regimen, Delirium Protocol, Frequent Pain Assessment and Oral Care  Additional Recommendations (Limitations, Scope, Preferences):  Full Scope Treatment  Psycho-social/Spiritual:   Desire for further Chaplaincy support:yes  Additional Recommendations: Education on Hospice  Prognosis:   < 12 months  Discharge Planning: To Be Determined      Primary Diagnoses: Present on Admission: . Delirium . Failure to thrive in adult   I have reviewed the medical record, interviewed the patient and family, and examined the patient. The following aspects are pertinent.  Past Medical History:  Diagnosis Date  . Anemia   .  B12 deficiency   . Delirium   . Diabetes mellitus without complication (HCC)   . Elevated TSH 06/18/2016  . Generalized weakness   . Hyperlipidemia   . Hypertension   . Idiopathic peripheral neuropathy   . Inability to walk   . Mobility impaired   . Neurologic gait dysfunction   . Parkinson's disease [facility])    Social History   Socioeconomic History  . Marital status: Widowed    Spouse name: Not on file  . Number of children: Not on file  . Years of education: Not on file  . Highest education level:  Not on file  Occupational History  . Not on file  Social Needs  . Financial resource strain: Not on file  . Food insecurity:    Worry: Not on file    Inability: Not on file  . Transportation needs:    Medical: Not on file    Non-medical: Not on file  Tobacco Use  . Smoking status: Former Smoker    Types: Cigarettes  . Smokeless tobacco: Never Used  Substance and Sexual Activity  . Alcohol use: No  . Drug use: No  . Sexual activity: Not on file  Lifestyle  . Physical activity:    Days per week: Not on file    Minutes per session: Not on file  . Stress: Not on file  Relationships  . Social connections:    Talks on phone: Not on file    Gets together: Not on file    Attends religious service: Not on file    Active member of club or organization: Not on file    Attends meetings of clubs or organizations: Not on file    Relationship status: Not on file  Other Topics Concern  . Not on file  Social History Narrative  . Not on file   Family History  Problem Relation Age of Onset  . Hypertension Mother   . Hypertension Father   . Heart disease Father   . Heart disease Sister   . Diabetes Sister   . Cancer Sister   . Cancer Brother    Scheduled Meds: . carbidopa-levodopa  1.5 tablet Oral TID  . divalproex  250 mg Oral BID  . docusate sodium  100 mg Oral BID  . enoxaparin (LOVENOX) injection  40 mg Subcutaneous Q24H  . escitalopram  10 mg Oral Daily  . levothyroxine  100 mcg Oral Q0600  . lisinopril  2.5 mg Oral Daily  . mirtazapine  7.5 mg Oral QHS  . polyvinyl alcohol  1 drop Both Eyes Daily  . rOPINIRole  0.5 mg Oral BID   Continuous Infusions: . cefTRIAXone (ROCEPHIN)  IV 1 g (09/03/18 2157)   PRN Meds:.haloperidol lactate Medications Prior to Admission:  Prior to Admission medications   Medication Sig Start Date End Date Taking? Authorizing Provider  acetaminophen (TYLENOL) 325 MG tablet Take 650 mg by mouth every 4 (four) hours as needed for mild pain or  moderate pain.   Yes [provider]  alendronate (FOSAMAX) 70 MG tablet Take 70 mg by mouth every Thursday. Take with a full glass of water on an empty stomach.  Take on Thursdays.   Yes [provider]  aspirin EC 81 MG tablet Take 81 mg by mouth daily.   Yes [provider]  carbidopa-levodopa (SINEMET IR) 25-100 MG tablet Take 1.5 tablets by mouth 3 (three) times daily.    Yes [provider]  divalproex (DEPAKOTE) 250 MG  DR tablet Take 250 mg by mouth 2 (two) times daily.   Yes [provider]  docusate sodium (COLACE) 100 MG capsule Take 100 mg by mouth 2 (two) times daily.   Yes [provider]  escitalopram (LEXAPRO) 10 MG tablet Take 10 mg by mouth daily.   Yes [provider]  hydroxypropyl methylcellulose / hypromellose (ISOPTO TEARS / GONIOVISC) 2.5 % ophthalmic solution Place 1 drop into both eyes daily.   Yes [provider]  lactulose (CHRONULAC) 10 GM/15ML solution Take 20 g by mouth daily.   Yes [provider]  levofloxacin (LEVAQUIN) 750 MG tablet Take 750 mg by mouth 2 (two) times daily.   Yes [provider]  levothyroxine (SYNTHROID, LEVOTHROID) 88 MCG tablet Take 88 mcg by mouth daily before breakfast.   Yes [provider]  lisinopril (PRINIVIL,ZESTRIL) 2.5 MG tablet Take 2.5 mg by mouth daily.  08/21/17  Yes [provider]  LORazepam (ATIVAN) 0.5 MG tablet Take 0.5 mg by mouth daily as needed for anxiety.   Yes [provider]  Melatonin 3 MG TABS Take 5 mg by mouth at bedtime.    Yes [provider]  memantine (NAMENDA) 10 MG tablet Take 10 mg by mouth 2 (two) times daily.   Yes [provider]  mirtazapine (REMERON) 15 MG tablet Take 15 mg by mouth at bedtime.   Yes [provider]  polyethylene glycol (MIRALAX / GLYCOLAX) packet Take 17 g by mouth daily.   Yes [provider]  rOPINIRole (REQUIP) 0.5 MG tablet Take 0.5 mg  by mouth 3 (three) times daily.    Yes [provider]  Vitamin D, Ergocalciferol, (DRISDOL) 1.25 MG (50000 UT) CAPS capsule Take 50,000 Units by mouth every 7 (seven) days.   Yes [provider]  neomycin-bacitracin-polymyxin (NEOSPORIN) ointment Apply 1 application topically every 12 (twelve) hours. To lacerations and skin tears x 4 days Patient not taking: Reported on 09/03/2018 11/24/17   Ward, Layla MawKristen N, DO   Allergies  Allergen Reactions  . Codeine   . Lipitor [Atorvastatin Calcium]    Review of Systems  Allergic/Immunologic: Positive for food allergies.    Physical Exam Constitutional:      Appearance: She is normal weight. She is ill-appearing.  Cardiovascular:     Rate and Rhythm: Normal rate and regular rhythm.     Heart sounds: Normal heart sounds.  Pulmonary:     Breath sounds: Decreased breath sounds present.  Skin:    General: Skin is warm and dry.  Neurological:     Mental Status: She is lethargic.  Psychiatric:        Speech: She is noncommunicative.        Cognition and Memory: Cognition is impaired.     Vital Signs: BP (!) 135/56 (BP Location: Left Arm)   Pulse 61   Temp 97.9 F (36.6 C) (Oral)   Resp 20   Ht 5\' 6"  (1.676 m)   Wt 68 kg   SpO2 93%   BMI 24.21 kg/m  Pain Scale: PAINAD POSS *See Group Information*: 1-Acceptable,Awake and alert Pain Score: Asleep   SpO2: SpO2: 93 % O2 Device:SpO2: 93 % O2 Flow Rate: .   IO: Intake/output summary:   Intake/Output Summary (Last 24 hours) at 09/04/2018 0904 Last data filed at 09/04/2018 0845 Gross per 24 hour  Intake 584.39 ml  Output 1000 ml  Net -415.61 ml    LBM: Last BM Date: 09/03/18 Baseline Weight: Weight: 68  kg Most recent weight: Weight: 68 kg     Palliative Assessment/Data: 20%   Discussed with Dr Rhona Leavenshiu  Time In: 1015 Time Out: 1130 Time Total: 75 minutes Greater than 50%  of this time was spent counseling and coordinating care related to the above assessment and  plan.  Signed by: Lorinda CreedMary Ondine Gemme, NP   Please contact Palliative Medicine Team phone at [phone] for questions and concerns.  For individual provider: See Loretha StaplerAmion

## 2018-09-04 NOTE — Progress Notes (Signed)
Occupational Therapy Evaluation Patient Details Name: Brandy Martinez MRN: [id number] DOB: 13-Dec-1931 Today's Date: 09/04/2018    History of Present Illness Pt admit with increaesd confusion and dementia iwth increased inability to eat. Pt with parkinson's and demntia at [place], recetnly her sister passed away and she has had a progresivbe decline per the nephew and chart. Nephew states before a month ago she would get up and walk with assistance and trasnfer to a chair at [facility]. However recently they stated she has not been eating and has had multiple falls trying to get out of bed or wc.    Clinical Impression   Evaluation limited by lethargy. Pt apparently trying to climb out of bed last night and given meds. Pt from snf and plan is to DC to SNF. Will defer further OT to SNF. Recommend B Prevalon boots to reduce risk of skin breakdown on heels.     Follow Up Recommendations  SNF;Supervision/Assistance - 24 hour    Equipment Recommendations       Recommendations for Other Services       Precautions / Restrictions Precautions Precautions: Fall Precaution Comments: at risk for skin breakdown      Mobility Bed Mobility Overal bed mobility: Needs Assistance             General bed mobility comments: too lethargic to mobilize  Transfers Overall transfer level: (unable, worked on sitting balance EOB for about 10 minutes with Mon-ModA at times )                    Balance                                           ADL either performed or assessed with clinical judgement   ADL                                         General ADL Comments: total A at this time     Vision         Perception     Praxis      Pertinent Vitals/Pain Pain Assessment: Faces Faces Pain Scale: No hurt     Hand Dominance     Extremity/Trunk Assessment Upper Extremity Assessment Upper Extremity Assessment: Generalized weakness   Lower  Extremity Assessment Lower Extremity Assessment: Defer to PT evaluation       Communication     Cognition Arousal/Alertness: Lethargic   Overall Cognitive Status: Impaired/Different from baseline(per nephew) Area of Impairment: Orientation;Attention;Awareness;Following commands;Memory;Problem solving;Safety/judgement                                   General Comments       Exercises     Shoulder Instructions      Home Living Family/patient expects to be discharged to:: Skilled nursing facility                                        Prior Functioning/Environment Level of Independence: Needs assistance  Gait / Transfers Assistance Needed: had assist from facility , was able to walk about a month ago  and still able to trasnfer with assistance per nephew.  ADL's / Homemaking Assistance Needed: assist from facility; unsure of PLOF            OT Problem List: Decreased strength;Decreased range of motion;Decreased activity tolerance;Impaired balance (sitting and/or standing);Decreased cognition      OT Treatment/Interventions:      OT Goals(Current goals can be found in the care plan section) Acute Rehab OT Goals Patient Stated Goal: unable to state OT Goal Formulation: Patient unable to participate in goal setting  OT Frequency:     Barriers to D/C:            Co-evaluation              AM-PAC OT "6 Clicks" Daily Activity     Outcome Measure Help from another person eating meals?: Total Help from another person taking care of personal grooming?: Total Help from another person toileting, which includes using toliet, bedpan, or urinal?: Total Help from another person bathing (including washing, rinsing, drying)?: Total Help from another person to put on and taking off regular upper body clothing?: Total Help from another person to put on and taking off regular lower body clothing?: Total 6 Click Score: 6   End of Session Nurse  Communication: Mobility status  Activity Tolerance: Patient limited by lethargy Patient left: in bed;with call bell/phone within reach;with bed alarm set  OT Visit Diagnosis: Other abnormalities of gait and mobility (R26.89);Other symptoms and signs involving cognitive function;Muscle weakness (generalized) (M62.81)                Time: 0955-1010 OT Time Calculation (min): 15 min Charges:  OT General Charges $OT Visit: 1 Visit OT Evaluation $OT Eval Low Complexity: 1 Low  Markie Frith, OT/L   Acute OT Clinical Specialist Acute Rehabilitation Services Pager [phone] Office [phone]   [facility] 09/04/2018, 10:25 AM

## 2018-09-05 DIAGNOSIS — N390 Urinary tract infection, site not specified: Secondary | ICD-10-CM

## 2018-09-05 DIAGNOSIS — R319 Hematuria, unspecified: Secondary | ICD-10-CM

## 2018-09-05 LAB — BASIC METABOLIC PANEL
Anion gap: 8 (ref 5–15)
BUN: 22 mg/dL (ref 8–23)
CO2: 19 mmol/L — ABNORMAL LOW (ref 22–32)
CREATININE: 0.66 mg/dL (ref 0.44–1.00)
Calcium: 8.1 mg/dL — ABNORMAL LOW (ref 8.9–10.3)
Chloride: 115 mmol/L — ABNORMAL HIGH (ref 98–111)
GFR calc Af Amer: >60 mL/min (ref >60)
GFR calc non Af Amer: >60 mL/min (ref >60)
Glucose, Bld: 130 mg/dL — ABNORMAL HIGH (ref 70–99)
Potassium: 3.1 mmol/L — ABNORMAL LOW (ref 3.5–5.1)
SODIUM: 142 mmol/L (ref 135–145)

## 2018-09-05 LAB — CBC
HCT: 35.6 % — ABNORMAL LOW (ref 36.0–46.0)
Hemoglobin: 11.3 g/dL — ABNORMAL LOW (ref 12.0–15.0)
MCH: 31.5 pg (ref 26.0–34.0)
MCHC: 31.7 g/dL (ref 30.0–36.0)
MCV: 99.2 fL (ref 80.0–100.0)
NRBC: 0 % (ref 0.0–0.2)
Platelets: 209 10*3/uL (ref 150–400)
RBC: 3.59 MIL/uL — ABNORMAL LOW (ref 3.87–5.11)
RDW: 11.9 % (ref 11.5–15.5)
WBC: 11.3 10*3/uL — AB (ref 4.0–10.5)

## 2018-09-05 LAB — MAGNESIUM: Magnesium: 1.8 mg/dL (ref 1.7–2.4)

## 2018-09-05 MED ORDER — POTASSIUM CHLORIDE CRYS ER 20 MEQ PO TBCR
40.0000 meq | EXTENDED_RELEASE_TABLET | Freq: Two times a day (BID) | ORAL | Status: AC
  Administered 2018-09-05 (×2): 40 meq via ORAL
  Filled 2018-09-05 (×2): qty 2

## 2018-09-05 NOTE — Clinical Social Work Note (Signed)
Clinical Social Work Assessment  Patient Details  Name: Brandy Martinez MRN: [id number] Date of Birth: February 12, 1932  Date of referral:  09/05/18               Reason for consult:  Discharge Planning                Permission sought to share information with:  Family Supports Permission granted to share information::     Name::     nephew Hospital doctor::     Relationship::     Contact Information:     Housing/Transportation Living arrangements for the past 2 months:  Skilled Building surveyor of Information:  Palliative Care Team, Facility(nephew) Patient Interpreter Needed:  None Criminal Activity/Legal Involvement Pertinent to Current Situation/Hospitalization:  No - Comment as needed Significant Relationships:  Merchandiser, retail, Other Family Members Lives with:  Facility Resident Do you feel safe going back to the place where you live?  Yes Need for family participation in patient care:  Yes (Comment)(nephew)  Care giving concerns:  Pt admitted from SNF - she has been long term care resident at [facility] x 3 years. Has dementia and Parkinson's disease, uses wheelchair and max assist with ADLs at baseline. Was admitted with delirium.  Social Worker assessment / plan:  CSW consulted to assist with disposition as pt is resident of SNF- [facility] long term care. Pt's nephew explains family wanting to move pt to [place] area to be closer to family. They are concerned that she is declining and will need more and more help in months to come and want to be closer to her.  CSW looked into facilities at their request but unable to find long term care bed available. Nephew encouraged to follow up with facilities and with social team at [facility] to continue seeking transfer if still desired in coming weeks. [facility] aware of pt's status and expecting pt's return at DC.   Employment status:  Retired Database administrator, [employer] In [place] PT Recommendations:  Skilled  Nursing Facility Information / Referral to community resources:  Skilled Nursing Facility  Patient/Family's Response to care:  Involved, appreciative  Patient/Family's Understanding of and Emotional Response to Diagnosis, Current Treatment, and Prognosis:  Nephew seems to have reasonable expectations as to the outcome of pt's care and treatment going forward. (see above). Pt with dementia and not oriented to meaningfully participate  Emotional Assessment Appearance:  Appears stated age Attitude/Demeanor/Rapport:  (disoriented) Affect (typically observed):  Calm Orientation:  Oriented to Self Alcohol / Substance use:  Not Applicable Psych involvement (Current and /or in the community):  No (Comment)  Discharge Needs  Concerns to be addressed:  Care Coordination Readmission within the last 30 days:  No Current discharge risk:  None Barriers to Discharge:  No Barriers Identified   Nelwyn Salisbury, LCSW 09/05/2018, 11:10 AM [phone]

## 2018-09-05 NOTE — Progress Notes (Signed)
PROGRESS NOTE    Brandy Martinez  NVB:[id number] DOB: 1932-01-24 DOA: 09/02/2018 PCP: System, Pcp Not In    Brief Narrative:  85 y.o. female with Parkinson's dementia, anemia, hypothyroidism, longterm resident at [facility], currently wheelchair, bound who presented from [place] at the behest of her nephew ([place]) who was concerned that patient has become more altered and agitated from baseline. Has reported that patient is not making sense in conversation and speaking about deceased family members. Also reported that patient has been refusing to eat her meals. Patient tells me that she feels that she has to urinate and feels poorly, but otherwise is unable to provide more detail. Per ED provider, the nephew is concerned that patient is "giving up" after her sister's passing this past December. According to rehab facility notes, patient completed a 5 day course of levofloxacin for presumed UTI. She appears to have a foley catheter placed sometime in the past 1-2 weeks.  Of note, patient's POA (nephew) was reportedly concerned about recent palliative care and code status discussions -- patient currently remains full code. Palliative care note from [facility] on 08/25/18 noted that patient has severe cognitive deficits and weakness thought related to progression of Parkinson's.   Assessment & Plan:   Active Problems:   DNR (do not resuscitate) discussion   Delirium   Failure to thrive in adult   Pressure injury of skin   Palliative care by specialist  # Delirium vs ongoing cognitive decline in setting of Parkinson's dementia and failure to thrive > UA + WBC, rare bacteria - urine culture pending - continue empiric ceftriaxone 1gm (D1 2/2) q24h given presenting delirium - TSH markedly elevated. Question compliance with levothyroxine as pt was observed pocketing medications - Ammonia normal - continue low dose depakote 250mg  BID (for mood stablization) - continue  sinemet 1.5mg  TID - continue lexapro 10mg  daily - namenda was placed on hold at time of presentation - reduced ropinirole to 0.5mg  BID from TID given high dose and no report of restless legs or tremor - held off on ativan given concern for delirium - reduced mirtazapine to 7.5mg  nightly (from 15mg  nightly) - Continued on haldol 2mg  IV q6h prn only for severe agitation - Continued on maintenance IV fluids NS at 100 cc/hr (received 1.5L in ED) - PT/OT and SW consulted. Recommendation for SNF - Consulted Palliative Care. POA to meet with palliative care later this afternoon for goals of care  # Hx of HLD   - Stable at this time  # HTN - Resumed lisinopril - BP improved - repeat bmet in AM  # Hypothyroidism on synthroid - per above, TSH markedly elevated at time of presentation -Pt observed to be pocketing levothyroxine earlier this admission. Question compliance with medication prior to admit -continue on increased levothyroxine dose of -Recommend repeat TSH in 4-6 weeks  # Dry eyes - continue with liquifilm tears  # Dehydration  - still with dry mucus membranes and poor skin turgor, limited PO intake  and decreased urine output this AM  - Will continue IVF hydration  - Repeat bmet in AM  # Wound on L anterior lower shin - wound care requested  DVT prophylaxis: Lovenox subQ Code Status: Full Family Communication: Pt in room Disposition Plan: Return to SNF possible in 24-48hrs if no longer dehydrated  Consultants:   Palliative Care  Procedures:     Antimicrobials: Anti-infectives (From admission, onward)   Start     Dose/Rate  Route Frequency Ordered Stop   09/03/18 2200  cefTRIAXone (ROCEPHIN) 1 g in sodium chloride 0.9 % 100 mL IVPB     1 g 200 mL/hr over 30 Minutes Intravenous Daily at bedtime 09/03/18 0534     09/03/18 0330  cefTRIAXone (ROCEPHIN) 1 g in sodium chloride 0.9 % 100 mL IVPB     1 g 200 mL/hr over 30 Minutes Intravenous  Once 09/03/18  0321 09/03/18 0435      Subjective: Confused and not conversant this AM  Objective: Vitals:   09/04/18 1326 09/04/18 2021 09/05/18 0532 09/05/18 1356  BP: (!) 120/54 131/66 (!) 154/70 119/67  Pulse:  92 88 86  Resp:  17 19 18   Temp:  98.9 F (37.2 C) 97.9 F (36.6 C) 99.8 F (37.7 C)  TempSrc:   Oral Oral  SpO2:  98% 98% 96%  Weight:      Height:        Intake/Output Summary (Last 24 hours) at 09/05/2018 1540 Last data filed at 09/05/2018 1400 Gross per 24 hour  Intake 490 ml  Output 1475 ml  Net -985 ml   Filed Weights   09/03/18 0001  Weight: 68 kg    Examination: General exam: Asleep, arousable, in no acute distress Respiratory system: normal chest rise, clear, no audible wheezing Cardiovascular system: regular rhythm, s1-s2 Gastrointestinal system: Nondistended, nontender, pos BS Central nervous system: No seizures, no tremors Extremities: No cyanosis, no joint deformities Skin: No rashes, no pallor Psychiatry: Unable to assess given confusion  Data Reviewed: I have personally reviewed following labs and imaging studies  CBC: Recent Labs  Lab 09/03/18 0043 09/04/18 0502 09/05/18 0342  WBC 10.7* 9.4 11.3*  NEUTROABS 7.3  --   --   HGB 12.5 11.0* 11.3*  HCT 38.8 35.7* 35.6*  MCV 100.8* 102.9* 99.2  PLT 205 177 209   Basic Metabolic Panel: Recent Labs  Lab 09/03/18 0043 09/04/18 0502 09/05/18 0342  NA 139 143 142  K 4.1 3.6 3.1*  CL 110 115* 115*  CO2 22 22 19*  GLUCOSE 227* 146* 130*  BUN 28* 23 22  CREATININE 0.72 0.74 0.66  CALCIUM 8.6* 7.9* 8.1*  MG  --  1.9 1.8   GFR: Estimated Creatinine Clearance: 49 mL/min (by C-G formula based on SCr of 0.66 mg/dL). Liver Function Tests: Recent Labs  Lab 09/03/18 0043  AST 39  ALT 38  ALKPHOS 55  BILITOT 0.6  PROT 6.6  ALBUMIN 2.9*   No results for input(s): LIPASE, AMYLASE in the last 168 hours. Recent Labs  Lab 09/03/18 0815  AMMONIA 21   Coagulation Profile: No results for  input(s): INR, PROTIME in the last 168 hours. Cardiac Enzymes: Recent Labs  Lab 09/03/18 0043  TROPONINI <0.03   BNP (last 3 results) No results for input(s): PROBNP in the last 8760 hours. HbA1C: No results for input(s): HGBA1C in the last 72 hours. CBG: No results for input(s): GLUCAP in the last 168 hours. Lipid Profile: No results for input(s): CHOL, HDL, LDLCALC, TRIG, CHOLHDL, LDLDIRECT in the last 72 hours. Thyroid Function Tests: Recent Labs    09/03/18 0532 09/03/18 0815  TSH 15.994*  --   FREET4  --  0.83   Anemia Panel: No results for input(s): VITAMINB12, FOLATE, FERRITIN, TIBC, IRON, RETICCTPCT in the last 72 hours. Sepsis Labs: Recent Labs  Lab 09/03/18 0043 09/03/18 0225  LATICACIDVEN 1.7 1.4    Recent Results (from the past 240 hour(s))  Urine culture  Status: Abnormal (Preliminary result)   Collection Time: 09/03/18  2:14 AM  Result Value Ref Range Status   Specimen Description   Final    URINE, CATHETERIZED Performed at [facility], [address] [place], [place] [address]    Special Requests   Final    Normal Performed at [facility], [address], [place], [place] [address]    Culture (A)  Final    20,000 COLONIES/mL PROVIDENCIA STUARTII 50,000 COLONIES/mL ENTEROCOCCUS FAECALIS SUSCEPTIBILITIES TO FOLLOW FOR ORGANISM 2 Performed at [facility], [address] [place], [place] [address]    Report Status PENDING  Incomplete   Organism ID, Bacteria PROVIDENCIA STUARTII (A)  Final      Susceptibility   Providencia stuartii - MIC*    AMPICILLIN >=32 RESISTANT Resistant     CEFAZOLIN >=64 RESISTANT Resistant     CEFTRIAXONE <=1 SENSITIVE Sensitive     CIPROFLOXACIN >=4 RESISTANT Resistant     GENTAMICIN RESISTANT Resistant     IMIPENEM 2 SENSITIVE Sensitive     NITROFURANTOIN 256 RESISTANT Resistant     TRIMETH/SULFA >=320 RESISTANT Resistant     AMPICILLIN/SULBACTAM >=32 RESISTANT  Resistant     PIP/TAZO <=4 SENSITIVE Sensitive     * 20,000 COLONIES/mL PROVIDENCIA STUARTII     Radiology Studies: No results found.  Scheduled Meds: . carbidopa-levodopa  1.5 tablet Oral TID  . divalproex  250 mg Oral BID  . docusate sodium  100 mg Oral BID  . enoxaparin (LOVENOX) injection  40 mg Subcutaneous Q24H  . escitalopram  10 mg Oral Daily  . levothyroxine  100 mcg Oral Q0600  . lisinopril  2.5 mg Oral Daily  . mirtazapine  7.5 mg Oral QHS  . polyvinyl alcohol  1 drop Both Eyes Daily  . rOPINIRole  0.5 mg Oral BID   Continuous Infusions: . sodium chloride 75 mL/hr at 09/05/18 0304  . cefTRIAXone (ROCEPHIN)  IV Stopped (09/04/18 2312)     LOS: 2 days   Rickey Barbara, MD [facility] Pager On Amion  If 7PM-7AM, please contact night-coverage 09/05/2018, 3:40 PM

## 2018-09-05 NOTE — Progress Notes (Signed)
Patient ID: Brandy Martinez, female   DOB: 1931/09/12, 85 y.o.   MRN: [id number]  This NP visited patient at the bedside as a follow up to  yesterday's GOCs meeting for palliative medicine needs and emotional support  Spoke by telephone on conference call with nephew/HPOA/ [name] and niece Drucilla Schmidt for continued conversation regarding diagnosis, prognosis,  GOC,  EOL wishes, disposition and options.   Detailed conversation regarding the natural trajectory and expectation of ES dementia. We discussed concepts specific to human mortality and failure to thrive.   Discussed the difference between and aggressive medical intervention path and a palliative  comfort path; concepts specific to CODE STATUS, artificial feeding and hydration, antibiotic use, and rehospitalization.  Family verbalize their frustration and struggles with advocating for the patient at the skilled nursing facility.  They do not believe that she gets "the care she deserves."  Family understand the long term poor prognosis, they hope for comfort and dignity and are open to hospice services at the facility if eligible.   Discussed with family the importance of continued conversation with  the medical providers regarding overall plan of care and treatment options,  ensuring decisions are within the context of the patients values and GOCs.  Questions and concerns addressed   Discussed with Dr Rhona Leavens  Total time spent on the unit was 60 minutes  Greater than 50% of the time was spent in counseling and coordination of care  Lorinda Creed NP  Palliative Medicine Team Team Phone # [phone] Pager [phone]

## 2018-09-06 DIAGNOSIS — N3001 Acute cystitis with hematuria: Secondary | ICD-10-CM

## 2018-09-06 DIAGNOSIS — L8995 Pressure ulcer of unspecified site, unstageable: Secondary | ICD-10-CM

## 2018-09-06 LAB — CBC
HEMATOCRIT: 38 % (ref 36.0–46.0)
Hemoglobin: 11.8 g/dL — ABNORMAL LOW (ref 12.0–15.0)
MCH: 31.9 pg (ref 26.0–34.0)
MCHC: 31.1 g/dL (ref 30.0–36.0)
MCV: 102.7 fL — ABNORMAL HIGH (ref 80.0–100.0)
Platelets: 186 10*3/uL (ref 150–400)
RBC: 3.7 MIL/uL — ABNORMAL LOW (ref 3.87–5.11)
RDW: 12 % (ref 11.5–15.5)
WBC: 7.8 10*3/uL (ref 4.0–10.5)
nRBC: 0 % (ref 0.0–0.2)

## 2018-09-06 LAB — BASIC METABOLIC PANEL
Anion gap: 6 (ref 5–15)
BUN: 16 mg/dL (ref 8–23)
CO2: 19 mmol/L — ABNORMAL LOW (ref 22–32)
Calcium: 8.1 mg/dL — ABNORMAL LOW (ref 8.9–10.3)
Chloride: 117 mmol/L — ABNORMAL HIGH (ref 98–111)
Creatinine, Ser: 0.6 mg/dL (ref 0.44–1.00)
GFR calc Af Amer: >60 mL/min (ref >60)
GFR calc non Af Amer: >60 mL/min (ref >60)
Glucose, Bld: 130 mg/dL — ABNORMAL HIGH (ref 70–99)
Potassium: 3.6 mmol/L (ref 3.5–5.1)
SODIUM: 142 mmol/L (ref 135–145)

## 2018-09-06 LAB — MAGNESIUM: Magnesium: 1.8 mg/dL (ref 1.7–2.4)

## 2018-09-06 MED ORDER — CEPHALEXIN 500 MG PO CAPS: 500.0000 mg | ORAL_CAPSULE | Freq: Four times a day (QID) | ORAL | 0 refills | Status: AC

## 2018-09-06 MED ORDER — LEVOTHYROXINE SODIUM 100 MCG PO TABS: 100.0000 ug | ORAL_TABLET | Freq: Every day | ORAL | 0 refills | Status: AC

## 2018-09-06 NOTE — Clinical Social Work Placement (Signed)
   CLINICAL SOCIAL WORK PLACEMENT  NOTE  Date:  09/06/2018  Patient Details  Name: Brandy Martinez MRN: [id number] Date of Birth: September 24, 1931  Clinical Social Work is seeking post-discharge placement for this patient at the Skilled  Nursing Facility level of care (*CSW will initial, date and re-position this form in  chart as items are completed):  Yes   Patient/family provided with [facility] Clinical Social Work Department's list of facilities offering this level of care within the geographic area requested by the patient (or if unable, by the patient's family).  Yes   Patient/family informed of their freedom to choose among providers that offer the needed level of care, that participate in Medicare, Medicaid or managed care program needed by the patient, have an available bed and are willing to accept the patient.      Patient/family informed of [facility]'s ownership interest in [facility] and [facility], as well as of the fact that they are under no obligation to receive care at these facilities.  PASRR submitted to EDS on       PASRR number received on       Existing PASRR number confirmed on       FL2 transmitted to all facilities in geographic area requested by pt/family on       FL2 transmitted to all facilities within larger geographic area on       Patient informed that his/her managed care company has contracts with or will negotiate with certain facilities, including the following:            Patient/family informed of bed offers received.  Patient chooses bed at [facility]     Physician recommends and patient chooses bed at      Patient to be transferred to [facility] on 09/06/18.  Patient to be transferred to facility by ptar     Patient family notified on 09/06/18 of transfer.  Name of family member notified:  spoke to family     PHYSICIAN       Additional Comment:    _______________________________________________ Althea Charon,  LCSW 09/06/2018, 1:30 PM

## 2018-09-06 NOTE — Clinical Social Work Placement (Signed)
   CLINICAL SOCIAL WORK PLACEMENT  NOTE  Date:  09/06/2018  Patient Details  Name: Brandy Martinez MRN: [id number] Date of Birth: 1932-06-17  Clinical Social Work is seeking post-discharge placement for this patient at the Skilled  Nursing Facility level of care (*CSW will initial, date and re-position this form in  chart as items are completed):  Yes   Patient/family provided with [facility] Clinical Social Work Department's list of facilities offering this level of care within the geographic area requested by the patient (or if unable, by the patient's family).  Yes   Patient/family informed of their freedom to choose among providers that offer the needed level of care, that participate in Medicare, Medicaid or managed care program needed by the patient, have an available bed and are willing to accept the patient.      Patient/family informed of [facility]'s ownership interest in [facility] and [facility], as well as of the fact that they are under no obligation to receive care at these facilities.  PASRR submitted to EDS on       PASRR number received on       Existing PASRR number confirmed on       FL2 transmitted to all facilities in geographic area requested by pt/family on       FL2 transmitted to all facilities within larger geographic area on       Patient informed that his/her managed care company has contracts with or will negotiate with certain facilities, including the following:            Patient/family informed of bed offers received.  Patient chooses bed at [facility]     Physician recommends and patient chooses bed at      Patient to be transferred to [facility] on 09/06/18.  Patient to be transferred to facility by ptar     Patient family notified on 09/06/18 of transfer.  Name of family member notified:  left voicemail for nephew     PHYSICIAN       Additional Comment:    _______________________________________________ Althea Charon,  LCSW 09/06/2018, 1:32 PM

## 2018-09-06 NOTE — Progress Notes (Addendum)
Left voicemail with pt's nephew Gerlene Burdock to assist with coordinating pt having hospice upon return to [facility].  Facility aware of plan to return with hospice, [facility] typically is agency facility residents use- CSW will discuss with nephew upon returned call. (If unable to link pt with hospice provider prior to DC, Sheliah Hatch will make referral once pt has returned)  Ilean Skill, MSW, LCSW Clinical Social Work 09/06/2018 [phone]

## 2018-09-06 NOTE — Progress Notes (Signed)
Clinical Social Worker facilitated patient discharge including contacting patient family and facility to confirm patient discharge plans.  Clinical information faxed to facility and family agreeable with plan.  CSW arranged ambulance transport via [facility] to [facility] .  RN to call [phone] (rm# [id number]) for report prior to discharge.  Clinical Social Worker will sign off for now as social work intervention is no longer needed. Please consult [place] again if new need arises.  Marrianne Mood, MSW, [employer] [phone]

## 2018-09-06 NOTE — Progress Notes (Signed)
Patient ID: [name], female   DOB: Jun 27, 1932, 85 y.o.   MRN: [id number]  This NP visited patient at the bedside as a follow up to  yesterday's GOCs meeting for palliative medicine needs and emotional support  Patient remains weak and lethargic, pleasantly confused, taking in poor po intake and remains high risk for decompensation 2/2 to ES dementia.  After long conversation yesterday with the patient's family the decision is for disposition to skilled nursing facility with hospice.  I did clarify with social work today and hospice benefit will be triggered.  Family understand the long term poor prognosis, they hope for comfort and dignity and are open to hospice services at the facility if eligible.   Discussed with Dr Nelson Chimes and Aundra Millet LCSW  Total time spent on the unit was 15 minutes  Greater than 50% of the time was spent in counseling and coordination of care  Lorinda Creed NP  Palliative Medicine Team Team Phone # [phone] Pager [phone]

## 2018-09-06 NOTE — Progress Notes (Signed)
Pt has been d/c to [facility] via [employer]

## 2018-09-06 NOTE — Care Management Important Message (Signed)
Important Message  Patient Details  Name: Brandy Martinez MRN: [id number] Date of Birth: 24-Dec-1931   Medicare Important Message Given:  Yes    Caren Macadam 09/06/2018, 10:54 AMImportant Message  Patient Details  Name: Brandy Martinez MRN: [id number] Date of Birth: 1932-03-18   Medicare Important Message Given:  Yes    Caren Macadam 09/06/2018, 10:54 AM

## 2018-09-06 NOTE — Discharge Summary (Signed)
Physician Discharge Summary  Brandy Martinez WEX:[id number] DOB: Aug 05, 1931 DOA: 09/02/2018  PCP: System, Pcp Not In  Admit date: 09/02/2018 Discharge date: 09/06/2018  Admitted From: SNF Disposition: SNF with hospice  Recommendations for Outpatient Follow-up:  1. Follow up with PCP in 1-2 weeks 2. Please obtain BMP/CBC in one week your next doctors visit.  3. Synthroid increased to 100 mcg 4. Oral Keflex for 3 more days  Discharge Condition: Stable CODE STATUS: Full Diet recommendation: As tolerated, regular  Brief/Interim Summary: 85 year old with history of dementia, anemia, hypothyroidism was sent to the hospital for evaluation of agitation and change in mental status.  Patient was recently diagnosed with urinary tract infection and completed 5-day course of Levaquin.  Upon admission it was noted that her confusion was likely due to delirium and advanced dementia.  She was also noted to have mild UTI therefore Rocephin was continued and was transitioned to Keflex.  It appears the urine culture was resistant to multiple drugs including fluoroquinolones.  Due to failure to thrive and advanced dementia, palliative care was consulted.  It was determined that patient should return back to skilled nursing facility and hospice team to follow there. At this point patient has reached max benefit from hospital stay and can return back with hospice team to follow there.  Unfortunately due to poor desire to eat she is at a very high risk for worsening of malnourishment and failure to thrive.   Discharge Diagnoses:  Active Problems:   DNR (do not resuscitate) discussion   Delirium   Failure to thrive in adult   Pressure injury of skin   Palliative care by specialist   Urinary tract infection with hematuria  Metabolic encephalopathy secondary to delirium Advanced Parkinson's disease Failure to thrive -Urine culture growing multiple bugs which is sensitive to Rocephin, will transition to 3 more days  of oral Keflex - Not sure if she takes all of her medications appropriately, I suspect she has very poor desire to eat and drink.  At this point we will continue her home medications hoping that she will take it.  Appreciate palliative care input, will transition her back to [facility] and have her hospitalist team follow. - Unfortunately her issues are due to advanced age therefore difficult to treat.  Hyperlipidemia -Stable  Essential hypertension -Resume home meds  Hypothyroidism - TSH is elevated, Synthroid has been increased to 100 mcg  She will remain high risk for readmission.  Hospice team needs to closely follow her outpatient.  Lovenox for DVT prophylaxis Full code Discharge to [facility] with hospice  Discharge Instructions   Allergies as of 09/06/2018      Reactions   Codeine    Lipitor [atorvastatin Calcium]       Medication List    STOP taking these medications   levofloxacin 750 MG tablet Commonly known as:  LEVAQUIN     TAKE these medications   acetaminophen 325 MG tablet Commonly known as:  TYLENOL Take 650 mg by mouth every 4 (four) hours as needed for mild pain or moderate pain.   alendronate 70 MG tablet Commonly known as:  FOSAMAX Take 70 mg by mouth every Thursday. Take with a full glass of water on an empty stomach.  Take on Thursdays.   aspirin EC 81 MG tablet Take 81 mg by mouth daily.   carbidopa-levodopa 25-100 MG tablet Commonly known as:  SINEMET IR Take 1.5 tablets by mouth 3 (three) times daily.   cephALEXin 500 MG capsule Commonly  known as:  KEFLEX Take 1 capsule (500 mg total) by mouth 4 (four) times daily for 3 days.   divalproex 250 MG DR tablet Commonly known as:  DEPAKOTE Take 250 mg by mouth 2 (two) times daily.   docusate sodium 100 MG capsule Commonly known as:  COLACE Take 100 mg by mouth 2 (two) times daily.   escitalopram 10 MG tablet Commonly known as:  LEXAPRO Take 10 mg by mouth daily.   hydroxypropyl  methylcellulose / hypromellose 2.5 % ophthalmic solution Commonly known as:  ISOPTO TEARS / GONIOVISC Place 1 drop into both eyes daily.   lactulose 10 GM/15ML solution Commonly known as:  CHRONULAC Take 20 g by mouth daily.   levothyroxine 100 MCG tablet Commonly known as:  SYNTHROID, LEVOTHROID Take 1 tablet (100 mcg total) by mouth daily at 6 (six) AM for 30 days. Start taking on:  September 07, 2018 What changed:    medication strength  how much to take  when to take this   lisinopril 2.5 MG tablet Commonly known as:  PRINIVIL,ZESTRIL Take 2.5 mg by mouth daily.   LORazepam 0.5 MG tablet Commonly known as:  ATIVAN Take 0.5 mg by mouth daily as needed for anxiety.   Melatonin 3 MG Tabs Take 5 mg by mouth at bedtime.   memantine 10 MG tablet Commonly known as:  NAMENDA Take 10 mg by mouth 2 (two) times daily.   mirtazapine 15 MG tablet Commonly known as:  REMERON Take 15 mg by mouth at bedtime.   neomycin-bacitracin-polymyxin ointment Commonly known as:  NEOSPORIN Apply 1 application topically every 12 (twelve) hours. To lacerations and skin tears x 4 days   polyethylene glycol packet Commonly known as:  MIRALAX / GLYCOLAX Take 17 g by mouth daily.   rOPINIRole 0.5 MG tablet Commonly known as:  REQUIP Take 0.5 mg by mouth 3 (three) times daily.   Vitamin D (Ergocalciferol) 1.25 MG (50000 UT) Caps capsule Commonly known as:  DRISDOL Take 50,000 Units by mouth every 7 (seven) days.       Allergies  Allergen Reactions  . Codeine   . Lipitor [Atorvastatin Calcium]     You were cared for by a hospitalist during your hospital stay. If you have any questions about your discharge medications or the care you received while you were in the hospital after you are discharged, you can call the unit and asked to speak with the hospitalist on call if the hospitalist that took care of you is not available. Once you are discharged, your primary care physician will  handle any further medical issues. Please note that no refills for any discharge medications will be authorized once you are discharged, as it is imperative that you return to your primary care physician (or establish a relationship with a primary care physician if you do not have one) for your aftercare needs so that they can reassess your need for medications and monitor your lab values.  Consultations:  Palliative care   Procedures/Studies: No results found.   Subjective: Patient laying in the bed, no complaints.   Discharge Exam: Vitals:   09/05/18 2052 09/06/18 0626  BP: 133/79 139/61  Pulse: 88 65  Resp: 18 18  Temp: 99.5 F (37.5 C) 98.6 F (37 C)  SpO2: 100% 100%   Vitals:   09/05/18 0532 09/05/18 1356 09/05/18 2052 09/06/18 0626  BP: (!) 154/70 119/67 133/79 139/61  Pulse: 88 86 88 65  Resp: 19 18 18  18  Temp: 97.9 F (36.6 C) 99.8 F (37.7 C) 99.5 F (37.5 C) 98.6 F (37 C)  TempSrc: Oral Oral Oral Oral  SpO2: 98% 96% 100% 100%  Weight:      Height:        General: Sleeping, not in acute distress.  Dry mouth. Cardiovascular: RRR, S1/S2 +, no rubs, no gallops Respiratory: CTA bilaterally, no wheezing, no rhonchi Abdominal: Soft, NT, ND, bowel sounds + Extremities: no edema, no cyanosis    The results of significant diagnostics from this hospitalization (including imaging, microbiology, ancillary and laboratory) are listed below for reference.     Microbiology: Recent Results (from the past 240 hour(s))  Urine culture     Status: Abnormal (Preliminary result)   Collection Time: 09/03/18  2:14 AM  Result Value Ref Range Status   Specimen Description   Final    URINE, CATHETERIZED Performed at [facility], [address] [place], [place] [address]    Special Requests   Final    Normal Performed at [facility], [address] [place], [place] [address]    Culture (A)  Final    20,000 COLONIES/mL  PROVIDENCIA STUARTII 50,000 COLONIES/mL ENTEROCOCCUS FAECALIS REPEATING SUSCEPTIBILITIES FOR ORGANISM 2 Performed at [facility], [address] [place], [place] [address]    Report Status PENDING  Incomplete   Organism ID, Bacteria PROVIDENCIA STUARTII (A)  Final      Susceptibility   Providencia stuartii - MIC*    AMPICILLIN >=32 RESISTANT Resistant     CEFAZOLIN >=64 RESISTANT Resistant     CEFTRIAXONE <=1 SENSITIVE Sensitive     CIPROFLOXACIN >=4 RESISTANT Resistant     GENTAMICIN RESISTANT Resistant     IMIPENEM 2 SENSITIVE Sensitive     NITROFURANTOIN 256 RESISTANT Resistant     TRIMETH/SULFA >=320 RESISTANT Resistant     AMPICILLIN/SULBACTAM >=32 RESISTANT Resistant     PIP/TAZO <=4 SENSITIVE Sensitive     * 20,000 COLONIES/mL PROVIDENCIA STUARTII     Labs: BNP (last 3 results) No results for input(s): BNP in the last 8760 hours. Basic Metabolic Panel: Recent Labs  Lab 09/03/18 0043 09/04/18 0502 09/05/18 0342 09/06/18 0340  NA 139 143 142 142  K 4.1 3.6 3.1* 3.6  CL 110 115* 115* 117*  CO2 22 22 19* 19*  GLUCOSE 227* 146* 130* 130*  BUN 28* 23 22 16   CREATININE 0.72 0.74 0.66 0.60  CALCIUM 8.6* 7.9* 8.1* 8.1*  MG  --  1.9 1.8 1.8   Liver Function Tests: Recent Labs  Lab 09/03/18 0043  AST 39  ALT 38  ALKPHOS 55  BILITOT 0.6  PROT 6.6  ALBUMIN 2.9*   No results for input(s): LIPASE, AMYLASE in the last 168 hours. Recent Labs  Lab 09/03/18 0815  AMMONIA 21   CBC: Recent Labs  Lab 09/03/18 0043 09/04/18 0502 09/05/18 0342 09/06/18 0340  WBC 10.7* 9.4 11.3* 7.8  NEUTROABS 7.3  --   --   --   HGB 12.5 11.0* 11.3* 11.8*  HCT 38.8 35.7* 35.6* 38.0  MCV 100.8* 102.9* 99.2 102.7*  PLT 205 177 209 186   Cardiac Enzymes: Recent Labs  Lab 09/03/18 0043  TROPONINI <0.03   BNP: Invalid input(s): POCBNP CBG: No results for input(s): GLUCAP in the last 168 hours. D-Dimer No results for input(s): DDIMER in the last 72 hours. Hgb  A1c No results for input(s): HGBA1C in the last 72 hours. Lipid Profile No results for  input(s): CHOL, HDL, LDLCALC, TRIG, CHOLHDL, LDLDIRECT in the last 72 hours. Thyroid function studies No results for input(s): TSH, T4TOTAL, T3FREE, THYROIDAB in the last 72 hours.  Invalid input(s): FREET3 Anemia work up No results for input(s): VITAMINB12, FOLATE, FERRITIN, TIBC, IRON, RETICCTPCT in the last 72 hours. Urinalysis    Component Value Date/Time   COLORURINE YELLOW 09/03/2018 0059   APPEARANCEUR HAZY (A) 09/03/2018 0059   LABSPEC 1.033 (H) 09/03/2018 0059   PHURINE 5.0 09/03/2018 0059   GLUCOSEU 50 (A) 09/03/2018 0059   HGBUR SMALL (A) 09/03/2018 0059   BILIRUBINUR NEGATIVE 09/03/2018 0059   KETONESUR 20 (A) 09/03/2018 0059   PROTEINUR 100 (A) 09/03/2018 0059   NITRITE NEGATIVE 09/03/2018 0059   LEUKOCYTESUR MODERATE (A) 09/03/2018 0059   Sepsis Labs Invalid input(s): PROCALCITONIN,  WBC,  LACTICIDVEN Microbiology Recent Results (from the past 240 hour(s))  Urine culture     Status: Abnormal (Preliminary result)   Collection Time: 09/03/18  2:14 AM  Result Value Ref Range Status   Specimen Description   Final    URINE, CATHETERIZED Performed at [facility], [address] [place], [place] [address]    Special Requests   Final    Normal Performed at [facility], [address] [place], [place] [address]    Culture (A)  Final    20,000 COLONIES/mL PROVIDENCIA STUARTII 50,000 COLONIES/mL ENTEROCOCCUS FAECALIS REPEATING SUSCEPTIBILITIES FOR ORGANISM 2 Performed at [facility], [address] [place], [place] [address]    Report Status PENDING  Incomplete   Organism ID, Bacteria PROVIDENCIA STUARTII (A)  Final      Susceptibility   Providencia stuartii - MIC*    AMPICILLIN >=32 RESISTANT Resistant     CEFAZOLIN >=64 RESISTANT Resistant     CEFTRIAXONE <=1 SENSITIVE Sensitive     CIPROFLOXACIN >=4 RESISTANT Resistant      GENTAMICIN RESISTANT Resistant     IMIPENEM 2 SENSITIVE Sensitive     NITROFURANTOIN 256 RESISTANT Resistant     TRIMETH/SULFA >=320 RESISTANT Resistant     AMPICILLIN/SULBACTAM >=32 RESISTANT Resistant     PIP/TAZO <=4 SENSITIVE Sensitive     * 20,000 COLONIES/mL PROVIDENCIA STUARTII     Time coordinating discharge:  I have spent 35 minutes face to face with the patient and on the ward discussing the patients care, assessment, plan and disposition with other care givers. >50% of the time was devoted counseling the patient about the risks and benefits of treatment/Discharge disposition and coordinating care.   SIGNED:   Dimple NanasAnkit Chirag Dyon Rotert, MD  [facility] 09/06/2018, 1:00 PM   If 7PM-7AM, please contact night-coverage [email or web address]

## 2018-09-07 LAB — URINE CULTURE: Special Requests: NORMAL

## 2018-09-12 ENCOUNTER — Non-Acute Institutional Stay: Payer: Medicare Other | Admitting: Internal Medicine

## 2018-09-12 DIAGNOSIS — Z515 Encounter for palliative care: Secondary | ICD-10-CM

## 2018-09-12 NOTE — Progress Notes (Signed)
Therapist, nutritional Palliative Care Consult Note Telephone: [phone]  Fax: [phone]  PATIENT NAME: Brandy Martinez DOB: [birth year] MRN: [id number]  PRIMARY CARE PROVIDER:   Reuel Boom, DNP/Dr. Lily Kocher  REFERRING PROVIDER: Reuel Boom, DNP  RESPONSIBLE PARTY:   [name] (nephew) [phone] EMERGENCY CONTACT:  Drucilla Schmidt (niece) [phone]   RECOMMENDATIONS and PLAN:  Palliative Care Encounter  Z51.5  1. Anorexia:  Nearing end of life.  Albumin 2.9 on 2/2.  Continue to offer comfort foods and oral hydration. Aspiration risk.  2.  Memory Loss with behavioral disturbance:  Advanced with progressive decline and related to advanced dementia.  FAST stage 7d.. Behaviors are controlled.  Continue supportive care. Attempt discussion with nephew/POA for goals of care  3. Weakness:  Related to advanced Parkinson disease with dementia.  No expectation for improvement.  Attempt to increase ;oral hydration(every 2 hours) and nutritional intake.  4.Advanced care planning:  Currently full code status prior to discussion with nephew/POA.  Lengthy phone discussion giving explanation of current health status with trajectory of additional decline.  Nephew's goals are to prevent suffering.  He is very hesitant to agree with comfort care/Hospice and reports that he will have to confer with his sister. (Pt has no children or spouse).  He does report that pt had told him "many years ago that when her time came, she wanted he and his sister to let her go".  Informed pt that she is approaching end of life and per her wishes, that pertained to a status of DNAR.  He phoned his sister and returned call to this NP.  Nephew Richard agreed to change code status to DNAR without return to the hospitial per pt's previous verbal wishes.  He stated that he and his sister would talk more and make a decision on Hospice care at facility which was recommended by this NP and hospital  Palliative team. Encouraged him to contact NP Merril Abbe or myself for report of this decision.  DNR form completed and placed in pt's chart.  Support given. No planned f/u if transitions to Hospice care.  (Family possibly desires [facility] due to close vicinity for visitation by family members.   I spent 120 minutes providing this consultation at bedside and nurse's station,  from 1530 to 1730. More than 50% of the time in this consultation was spent coordinating communication with patient, clinical staff, NP and asst administrator.   HISTORY OF PRESENT ILLNESS:  Brandy Martinez is a 85 y.o. year old female with multiple medical problems including advanced Parkinsons disease with dementia. She was recently hospitalized due to increased weakness, anorexia and dehydration.  Recommendations from hospital were to have pt transitioned Hospice care at facility.  POA nephew and niece have declined Hospice admission prior to today's visit.  Palliative Care was asked to help address goals of care and ACP  CODE STATUS:  DNAR without return to hospital  PPS: 20%  Sips only  HOSPICE ELIGIBILITY/DIAGNOSIS: YES/End stage Parkansan disease with advanced dementia  PAST MEDICAL HISTORY:  Past Medical History:  Diagnosis Date  . Anemia   . B12 deficiency   . Delirium   . Diabetes mellitus without complication (HCC)   . Elevated TSH 06/18/2016  . Generalized weakness   . Hyperlipidemia   . Hypertension   . Idiopathic peripheral neuropathy   . Inability to walk   . Mobility impaired   . Neurologic gait dysfunction   . Parkinson's disease (HCC)  PHYSICAL EXAM:   General: Very frail and thin elderly female in bed.  In NAD ENT:  Dry  Mucus membranes.   Cardiovascular: regular rate and rhythm Pulmonary: without increased respiratory effort Abdomen: soft, concave, nontender, + bowel sounds GU: no suprapubic tenderness Extremities: no edema, no joint deformities Skin:poor skin  turgor Neurological: Somnolent, resting with eyes closed.  Humming/singing.  Unintelligible words Psych:  Calm  [name], NP

## 2018-10-04 NOTE — ACP (Advance Care Planning) (Signed)
Code status changed to DNAR per telephone discussion with Richard Austin(POA/nephew)

## 2019-01-31 DEATH — deceased
